# Patient Record
Sex: Female | Born: 1945
Health system: Southern US, Community
[De-identification: ages and names within clinical notes are randomized; demographics above are authoritative.]

## PROBLEM LIST (undated history)

## (undated) DIAGNOSIS — Z6827 Body mass index (BMI) 27.0-27.9, adult: Secondary | ICD-10-CM

## (undated) DIAGNOSIS — E663 Overweight: Secondary | ICD-10-CM

## (undated) HISTORY — DX: Body mass index (BMI) 27.0-27.9, adult: Z68.27

## (undated) HISTORY — PX: KNEE ARTHROSCOPY: SHX127

## (undated) HISTORY — PX: EXCISION OF BREAST BIOPSY: SHX5822

## (undated) HISTORY — PX: ABDOMINOPLASTY: SHX5355

## (undated) HISTORY — PX: LIPOSUCTION: SHX10

## (undated) HISTORY — DX: Overweight: E66.3

---

## 2013-08-06 DIAGNOSIS — H4011X0 Primary open-angle glaucoma, stage unspecified: Secondary | ICD-10-CM | POA: Diagnosis not present

## 2013-08-07 DIAGNOSIS — J209 Acute bronchitis, unspecified: Secondary | ICD-10-CM | POA: Diagnosis not present

## 2013-08-07 DIAGNOSIS — R0609 Other forms of dyspnea: Secondary | ICD-10-CM | POA: Diagnosis not present

## 2013-09-13 DIAGNOSIS — H4011X0 Primary open-angle glaucoma, stage unspecified: Secondary | ICD-10-CM | POA: Diagnosis not present

## 2013-11-08 DIAGNOSIS — H4011X0 Primary open-angle glaucoma, stage unspecified: Secondary | ICD-10-CM | POA: Diagnosis not present

## 2014-03-10 DIAGNOSIS — H4011X0 Primary open-angle glaucoma, stage unspecified: Secondary | ICD-10-CM | POA: Diagnosis not present

## 2014-03-19 DIAGNOSIS — L570 Actinic keratosis: Secondary | ICD-10-CM | POA: Diagnosis not present

## 2014-03-19 DIAGNOSIS — L255 Unspecified contact dermatitis due to plants, except food: Secondary | ICD-10-CM | POA: Diagnosis not present

## 2014-03-19 DIAGNOSIS — D239 Other benign neoplasm of skin, unspecified: Secondary | ICD-10-CM | POA: Diagnosis not present

## 2014-03-19 DIAGNOSIS — L821 Other seborrheic keratosis: Secondary | ICD-10-CM | POA: Diagnosis not present

## 2014-03-22 DIAGNOSIS — Z23 Encounter for immunization: Secondary | ICD-10-CM | POA: Diagnosis not present

## 2014-03-22 DIAGNOSIS — L255 Unspecified contact dermatitis due to plants, except food: Secondary | ICD-10-CM | POA: Diagnosis not present

## 2014-04-10 DIAGNOSIS — M5408 Panniculitis affecting regions of neck and back, sacral and sacrococcygeal region: Secondary | ICD-10-CM | POA: Diagnosis not present

## 2014-04-10 DIAGNOSIS — M9903 Segmental and somatic dysfunction of lumbar region: Secondary | ICD-10-CM | POA: Diagnosis not present

## 2014-04-10 DIAGNOSIS — M9901 Segmental and somatic dysfunction of cervical region: Secondary | ICD-10-CM | POA: Diagnosis not present

## 2014-04-10 DIAGNOSIS — M9904 Segmental and somatic dysfunction of sacral region: Secondary | ICD-10-CM | POA: Diagnosis not present

## 2014-04-10 DIAGNOSIS — M9902 Segmental and somatic dysfunction of thoracic region: Secondary | ICD-10-CM | POA: Diagnosis not present

## 2014-04-14 DIAGNOSIS — M9902 Segmental and somatic dysfunction of thoracic region: Secondary | ICD-10-CM | POA: Diagnosis not present

## 2014-04-14 DIAGNOSIS — M5408 Panniculitis affecting regions of neck and back, sacral and sacrococcygeal region: Secondary | ICD-10-CM | POA: Diagnosis not present

## 2014-04-14 DIAGNOSIS — M9901 Segmental and somatic dysfunction of cervical region: Secondary | ICD-10-CM | POA: Diagnosis not present

## 2014-04-14 DIAGNOSIS — M9903 Segmental and somatic dysfunction of lumbar region: Secondary | ICD-10-CM | POA: Diagnosis not present

## 2014-04-14 DIAGNOSIS — M9904 Segmental and somatic dysfunction of sacral region: Secondary | ICD-10-CM | POA: Diagnosis not present

## 2014-04-21 DIAGNOSIS — H4011X3 Primary open-angle glaucoma, severe stage: Secondary | ICD-10-CM | POA: Diagnosis not present

## 2014-04-21 DIAGNOSIS — H4011X1 Primary open-angle glaucoma, mild stage: Secondary | ICD-10-CM | POA: Diagnosis not present

## 2014-04-23 DIAGNOSIS — H01005 Unspecified blepharitis left lower eyelid: Secondary | ICD-10-CM | POA: Diagnosis not present

## 2014-04-23 DIAGNOSIS — H01004 Unspecified blepharitis left upper eyelid: Secondary | ICD-10-CM | POA: Diagnosis not present

## 2014-04-23 DIAGNOSIS — H4010X3 Unspecified open-angle glaucoma, severe stage: Secondary | ICD-10-CM | POA: Diagnosis not present

## 2014-04-23 DIAGNOSIS — H01002 Unspecified blepharitis right lower eyelid: Secondary | ICD-10-CM | POA: Diagnosis not present

## 2014-04-23 DIAGNOSIS — H01001 Unspecified blepharitis right upper eyelid: Secondary | ICD-10-CM | POA: Diagnosis not present

## 2014-05-05 DIAGNOSIS — Z0000 Encounter for general adult medical examination without abnormal findings: Secondary | ICD-10-CM | POA: Diagnosis not present

## 2014-05-05 DIAGNOSIS — A609 Anogenital herpesviral infection, unspecified: Secondary | ICD-10-CM | POA: Diagnosis not present

## 2014-05-05 DIAGNOSIS — Z113 Encounter for screening for infections with a predominantly sexual mode of transmission: Secondary | ICD-10-CM | POA: Diagnosis not present

## 2014-05-05 DIAGNOSIS — R61 Generalized hyperhidrosis: Secondary | ICD-10-CM | POA: Diagnosis not present

## 2014-05-05 DIAGNOSIS — R635 Abnormal weight gain: Secondary | ICD-10-CM | POA: Diagnosis not present

## 2014-05-05 DIAGNOSIS — Z6830 Body mass index (BMI) 30.0-30.9, adult: Secondary | ICD-10-CM | POA: Diagnosis not present

## 2014-05-05 DIAGNOSIS — R05 Cough: Secondary | ICD-10-CM | POA: Diagnosis not present

## 2014-05-05 DIAGNOSIS — Z23 Encounter for immunization: Secondary | ICD-10-CM | POA: Diagnosis not present

## 2014-05-05 DIAGNOSIS — M859 Disorder of bone density and structure, unspecified: Secondary | ICD-10-CM | POA: Diagnosis not present

## 2014-05-08 DIAGNOSIS — R05 Cough: Secondary | ICD-10-CM | POA: Diagnosis not present

## 2014-05-09 DIAGNOSIS — Z6830 Body mass index (BMI) 30.0-30.9, adult: Secondary | ICD-10-CM | POA: Diagnosis not present

## 2014-05-09 DIAGNOSIS — R635 Abnormal weight gain: Secondary | ICD-10-CM | POA: Diagnosis not present

## 2014-05-09 DIAGNOSIS — M859 Disorder of bone density and structure, unspecified: Secondary | ICD-10-CM | POA: Diagnosis not present

## 2014-05-09 DIAGNOSIS — R61 Generalized hyperhidrosis: Secondary | ICD-10-CM | POA: Diagnosis not present

## 2014-05-09 DIAGNOSIS — E780 Pure hypercholesterolemia: Secondary | ICD-10-CM | POA: Diagnosis not present

## 2014-05-09 DIAGNOSIS — Z0000 Encounter for general adult medical examination without abnormal findings: Secondary | ICD-10-CM | POA: Diagnosis not present

## 2014-05-13 DIAGNOSIS — M8580 Other specified disorders of bone density and structure, unspecified site: Secondary | ICD-10-CM | POA: Diagnosis not present

## 2014-05-13 DIAGNOSIS — Z1231 Encounter for screening mammogram for malignant neoplasm of breast: Secondary | ICD-10-CM | POA: Diagnosis not present

## 2014-06-06 DIAGNOSIS — H4011X1 Primary open-angle glaucoma, mild stage: Secondary | ICD-10-CM | POA: Diagnosis not present

## 2014-06-06 DIAGNOSIS — H4011X3 Primary open-angle glaucoma, severe stage: Secondary | ICD-10-CM | POA: Diagnosis not present

## 2014-07-15 DIAGNOSIS — H4011X1 Primary open-angle glaucoma, mild stage: Secondary | ICD-10-CM | POA: Diagnosis not present

## 2014-07-15 DIAGNOSIS — H4011X3 Primary open-angle glaucoma, severe stage: Secondary | ICD-10-CM | POA: Diagnosis not present

## 2014-07-25 DIAGNOSIS — H01004 Unspecified blepharitis left upper eyelid: Secondary | ICD-10-CM | POA: Diagnosis not present

## 2014-07-25 DIAGNOSIS — H01001 Unspecified blepharitis right upper eyelid: Secondary | ICD-10-CM | POA: Diagnosis not present

## 2014-07-25 DIAGNOSIS — H01005 Unspecified blepharitis left lower eyelid: Secondary | ICD-10-CM | POA: Diagnosis not present

## 2014-07-25 DIAGNOSIS — H01002 Unspecified blepharitis right lower eyelid: Secondary | ICD-10-CM | POA: Diagnosis not present

## 2014-07-25 DIAGNOSIS — H5213 Myopia, bilateral: Secondary | ICD-10-CM | POA: Diagnosis not present

## 2014-07-29 DIAGNOSIS — L820 Inflamed seborrheic keratosis: Secondary | ICD-10-CM | POA: Diagnosis not present

## 2014-07-29 DIAGNOSIS — L570 Actinic keratosis: Secondary | ICD-10-CM | POA: Diagnosis not present

## 2014-10-07 DIAGNOSIS — H4011X1 Primary open-angle glaucoma, mild stage: Secondary | ICD-10-CM | POA: Diagnosis not present

## 2014-10-07 DIAGNOSIS — T1511XA Foreign body in conjunctival sac, right eye, initial encounter: Secondary | ICD-10-CM | POA: Diagnosis not present

## 2014-10-07 DIAGNOSIS — H4011X3 Primary open-angle glaucoma, severe stage: Secondary | ICD-10-CM | POA: Diagnosis not present

## 2014-12-17 DIAGNOSIS — H4011X1 Primary open-angle glaucoma, mild stage: Secondary | ICD-10-CM | POA: Diagnosis not present

## 2014-12-17 DIAGNOSIS — H4011X3 Primary open-angle glaucoma, severe stage: Secondary | ICD-10-CM | POA: Diagnosis not present

## 2015-01-28 DIAGNOSIS — H4011X1 Primary open-angle glaucoma, mild stage: Secondary | ICD-10-CM | POA: Diagnosis not present

## 2015-01-28 DIAGNOSIS — H4011X3 Primary open-angle glaucoma, severe stage: Secondary | ICD-10-CM | POA: Diagnosis not present

## 2015-02-10 DIAGNOSIS — H4010X0 Unspecified open-angle glaucoma, stage unspecified: Secondary | ICD-10-CM | POA: Diagnosis not present

## 2015-02-10 DIAGNOSIS — H4011X3 Primary open-angle glaucoma, severe stage: Secondary | ICD-10-CM | POA: Diagnosis not present

## 2015-02-27 DIAGNOSIS — J189 Pneumonia, unspecified organism: Secondary | ICD-10-CM | POA: Diagnosis not present

## 2015-04-09 DIAGNOSIS — D225 Melanocytic nevi of trunk: Secondary | ICD-10-CM | POA: Diagnosis not present

## 2015-04-09 DIAGNOSIS — L821 Other seborrheic keratosis: Secondary | ICD-10-CM | POA: Diagnosis not present

## 2015-04-09 DIAGNOSIS — L570 Actinic keratosis: Secondary | ICD-10-CM | POA: Diagnosis not present

## 2015-04-29 DIAGNOSIS — Z23 Encounter for immunization: Secondary | ICD-10-CM | POA: Diagnosis not present

## 2015-06-02 DIAGNOSIS — Z1231 Encounter for screening mammogram for malignant neoplasm of breast: Secondary | ICD-10-CM | POA: Diagnosis not present

## 2015-07-29 DIAGNOSIS — H401193 Primary open-angle glaucoma, unspecified eye, severe stage: Secondary | ICD-10-CM | POA: Diagnosis not present

## 2015-07-31 DIAGNOSIS — I249 Acute ischemic heart disease, unspecified: Secondary | ICD-10-CM | POA: Diagnosis not present

## 2015-07-31 DIAGNOSIS — R001 Bradycardia, unspecified: Secondary | ICD-10-CM | POA: Diagnosis not present

## 2015-07-31 DIAGNOSIS — R0789 Other chest pain: Secondary | ICD-10-CM | POA: Diagnosis not present

## 2015-07-31 DIAGNOSIS — R7989 Other specified abnormal findings of blood chemistry: Secondary | ICD-10-CM | POA: Diagnosis not present

## 2015-07-31 DIAGNOSIS — R079 Chest pain, unspecified: Secondary | ICD-10-CM | POA: Diagnosis not present

## 2015-07-31 DIAGNOSIS — R05 Cough: Secondary | ICD-10-CM | POA: Diagnosis not present

## 2015-08-01 DIAGNOSIS — R001 Bradycardia, unspecified: Secondary | ICD-10-CM | POA: Diagnosis present

## 2015-08-01 DIAGNOSIS — K219 Gastro-esophageal reflux disease without esophagitis: Secondary | ICD-10-CM | POA: Diagnosis present

## 2015-08-01 DIAGNOSIS — I249 Acute ischemic heart disease, unspecified: Secondary | ICD-10-CM | POA: Diagnosis not present

## 2015-08-01 DIAGNOSIS — Z79899 Other long term (current) drug therapy: Secondary | ICD-10-CM | POA: Diagnosis not present

## 2015-08-01 DIAGNOSIS — Z882 Allergy status to sulfonamides status: Secondary | ICD-10-CM | POA: Diagnosis not present

## 2015-08-01 DIAGNOSIS — Z881 Allergy status to other antibiotic agents status: Secondary | ICD-10-CM | POA: Diagnosis not present

## 2015-08-01 DIAGNOSIS — R05 Cough: Secondary | ICD-10-CM | POA: Diagnosis not present

## 2015-08-01 DIAGNOSIS — R079 Chest pain, unspecified: Secondary | ICD-10-CM | POA: Diagnosis not present

## 2015-08-01 DIAGNOSIS — R7989 Other specified abnormal findings of blood chemistry: Secondary | ICD-10-CM | POA: Diagnosis not present

## 2015-08-01 DIAGNOSIS — R0789 Other chest pain: Secondary | ICD-10-CM | POA: Diagnosis not present

## 2015-08-01 DIAGNOSIS — R0902 Hypoxemia: Secondary | ICD-10-CM | POA: Diagnosis present

## 2015-08-01 DIAGNOSIS — R030 Elevated blood-pressure reading, without diagnosis of hypertension: Secondary | ICD-10-CM | POA: Diagnosis not present

## 2015-08-11 DIAGNOSIS — H401123 Primary open-angle glaucoma, left eye, severe stage: Secondary | ICD-10-CM | POA: Diagnosis not present

## 2015-08-11 DIAGNOSIS — R079 Chest pain, unspecified: Secondary | ICD-10-CM | POA: Diagnosis not present

## 2015-08-11 DIAGNOSIS — R05 Cough: Secondary | ICD-10-CM | POA: Diagnosis not present

## 2015-08-11 DIAGNOSIS — H401111 Primary open-angle glaucoma, right eye, mild stage: Secondary | ICD-10-CM | POA: Diagnosis not present

## 2015-09-01 DIAGNOSIS — R072 Precordial pain: Secondary | ICD-10-CM | POA: Diagnosis not present

## 2015-09-01 DIAGNOSIS — K219 Gastro-esophageal reflux disease without esophagitis: Secondary | ICD-10-CM | POA: Diagnosis not present

## 2015-09-01 DIAGNOSIS — R05 Cough: Secondary | ICD-10-CM | POA: Diagnosis not present

## 2015-09-28 DIAGNOSIS — H01005 Unspecified blepharitis left lower eyelid: Secondary | ICD-10-CM | POA: Diagnosis not present

## 2015-09-28 DIAGNOSIS — H01004 Unspecified blepharitis left upper eyelid: Secondary | ICD-10-CM | POA: Diagnosis not present

## 2015-09-28 DIAGNOSIS — H01001 Unspecified blepharitis right upper eyelid: Secondary | ICD-10-CM | POA: Diagnosis not present

## 2015-09-28 DIAGNOSIS — H01002 Unspecified blepharitis right lower eyelid: Secondary | ICD-10-CM | POA: Diagnosis not present

## 2015-11-04 DIAGNOSIS — J3089 Other allergic rhinitis: Secondary | ICD-10-CM | POA: Diagnosis not present

## 2015-11-04 DIAGNOSIS — Z23 Encounter for immunization: Secondary | ICD-10-CM | POA: Diagnosis not present

## 2015-11-04 DIAGNOSIS — E669 Obesity, unspecified: Secondary | ICD-10-CM | POA: Diagnosis not present

## 2015-11-04 DIAGNOSIS — Z0000 Encounter for general adult medical examination without abnormal findings: Secondary | ICD-10-CM | POA: Diagnosis not present

## 2015-11-04 DIAGNOSIS — K219 Gastro-esophageal reflux disease without esophagitis: Secondary | ICD-10-CM | POA: Diagnosis not present

## 2015-11-04 DIAGNOSIS — R05 Cough: Secondary | ICD-10-CM | POA: Diagnosis not present

## 2015-11-04 DIAGNOSIS — Z79899 Other long term (current) drug therapy: Secondary | ICD-10-CM | POA: Diagnosis not present

## 2015-11-04 DIAGNOSIS — Z136 Encounter for screening for cardiovascular disorders: Secondary | ICD-10-CM | POA: Diagnosis not present

## 2015-11-04 DIAGNOSIS — M859 Disorder of bone density and structure, unspecified: Secondary | ICD-10-CM | POA: Diagnosis not present

## 2015-11-04 DIAGNOSIS — Z6830 Body mass index (BMI) 30.0-30.9, adult: Secondary | ICD-10-CM | POA: Diagnosis not present

## 2015-11-27 DIAGNOSIS — H401111 Primary open-angle glaucoma, right eye, mild stage: Secondary | ICD-10-CM | POA: Diagnosis not present

## 2015-11-27 DIAGNOSIS — H401123 Primary open-angle glaucoma, left eye, severe stage: Secondary | ICD-10-CM | POA: Diagnosis not present

## 2015-12-03 DIAGNOSIS — H01004 Unspecified blepharitis left upper eyelid: Secondary | ICD-10-CM | POA: Diagnosis not present

## 2015-12-03 DIAGNOSIS — H01001 Unspecified blepharitis right upper eyelid: Secondary | ICD-10-CM | POA: Diagnosis not present

## 2015-12-03 DIAGNOSIS — H01005 Unspecified blepharitis left lower eyelid: Secondary | ICD-10-CM | POA: Diagnosis not present

## 2015-12-03 DIAGNOSIS — H01002 Unspecified blepharitis right lower eyelid: Secondary | ICD-10-CM | POA: Diagnosis not present

## 2015-12-07 DIAGNOSIS — J069 Acute upper respiratory infection, unspecified: Secondary | ICD-10-CM | POA: Diagnosis not present

## 2016-01-13 DIAGNOSIS — H01002 Unspecified blepharitis right lower eyelid: Secondary | ICD-10-CM | POA: Diagnosis not present

## 2016-01-13 DIAGNOSIS — H01001 Unspecified blepharitis right upper eyelid: Secondary | ICD-10-CM | POA: Diagnosis not present

## 2016-01-13 DIAGNOSIS — H01005 Unspecified blepharitis left lower eyelid: Secondary | ICD-10-CM | POA: Diagnosis not present

## 2016-01-13 DIAGNOSIS — H5213 Myopia, bilateral: Secondary | ICD-10-CM | POA: Diagnosis not present

## 2016-01-13 DIAGNOSIS — H01004 Unspecified blepharitis left upper eyelid: Secondary | ICD-10-CM | POA: Diagnosis not present

## 2016-04-01 DIAGNOSIS — H401123 Primary open-angle glaucoma, left eye, severe stage: Secondary | ICD-10-CM | POA: Diagnosis not present

## 2016-04-05 DIAGNOSIS — J0100 Acute maxillary sinusitis, unspecified: Secondary | ICD-10-CM | POA: Diagnosis not present

## 2016-04-21 DIAGNOSIS — Z23 Encounter for immunization: Secondary | ICD-10-CM | POA: Diagnosis not present

## 2016-05-23 DIAGNOSIS — H01002 Unspecified blepharitis right lower eyelid: Secondary | ICD-10-CM | POA: Diagnosis not present

## 2016-05-23 DIAGNOSIS — H01001 Unspecified blepharitis right upper eyelid: Secondary | ICD-10-CM | POA: Diagnosis not present

## 2016-05-23 DIAGNOSIS — H01005 Unspecified blepharitis left lower eyelid: Secondary | ICD-10-CM | POA: Diagnosis not present

## 2016-05-23 DIAGNOSIS — H01004 Unspecified blepharitis left upper eyelid: Secondary | ICD-10-CM | POA: Diagnosis not present

## 2016-05-24 DIAGNOSIS — D225 Melanocytic nevi of trunk: Secondary | ICD-10-CM | POA: Diagnosis not present

## 2016-05-24 DIAGNOSIS — L859 Epidermal thickening, unspecified: Secondary | ICD-10-CM | POA: Diagnosis not present

## 2016-05-24 DIAGNOSIS — L821 Other seborrheic keratosis: Secondary | ICD-10-CM | POA: Diagnosis not present

## 2016-05-24 DIAGNOSIS — Z23 Encounter for immunization: Secondary | ICD-10-CM | POA: Diagnosis not present

## 2016-05-24 DIAGNOSIS — L570 Actinic keratosis: Secondary | ICD-10-CM | POA: Diagnosis not present

## 2016-05-24 DIAGNOSIS — L814 Other melanin hyperpigmentation: Secondary | ICD-10-CM | POA: Diagnosis not present

## 2016-05-24 DIAGNOSIS — D1801 Hemangioma of skin and subcutaneous tissue: Secondary | ICD-10-CM | POA: Diagnosis not present

## 2016-07-27 DIAGNOSIS — H401123 Primary open-angle glaucoma, left eye, severe stage: Secondary | ICD-10-CM | POA: Diagnosis not present

## 2016-09-05 DIAGNOSIS — H401111 Primary open-angle glaucoma, right eye, mild stage: Secondary | ICD-10-CM | POA: Diagnosis not present

## 2016-09-05 DIAGNOSIS — H401123 Primary open-angle glaucoma, left eye, severe stage: Secondary | ICD-10-CM | POA: Diagnosis not present

## 2016-09-08 DIAGNOSIS — M25375 Other instability, left foot: Secondary | ICD-10-CM

## 2016-09-08 DIAGNOSIS — M76822 Posterior tibial tendinitis, left leg: Secondary | ICD-10-CM

## 2016-09-08 DIAGNOSIS — S96912A Strain of unspecified muscle and tendon at ankle and foot level, left foot, initial encounter: Secondary | ICD-10-CM

## 2016-09-22 DIAGNOSIS — M76822 Posterior tibial tendinitis, left leg: Secondary | ICD-10-CM | POA: Diagnosis not present

## 2016-10-18 DIAGNOSIS — M76822 Posterior tibial tendinitis, left leg: Secondary | ICD-10-CM | POA: Diagnosis not present

## 2016-10-18 DIAGNOSIS — S96912D Strain of unspecified muscle and tendon at ankle and foot level, left foot, subsequent encounter: Secondary | ICD-10-CM | POA: Diagnosis not present

## 2016-10-31 DIAGNOSIS — R05 Cough: Secondary | ICD-10-CM | POA: Diagnosis not present

## 2016-10-31 DIAGNOSIS — E784 Other hyperlipidemia: Secondary | ICD-10-CM | POA: Diagnosis not present

## 2016-10-31 DIAGNOSIS — R739 Hyperglycemia, unspecified: Secondary | ICD-10-CM | POA: Diagnosis not present

## 2016-10-31 DIAGNOSIS — Z1239 Encounter for other screening for malignant neoplasm of breast: Secondary | ICD-10-CM | POA: Diagnosis not present

## 2016-10-31 DIAGNOSIS — Z79899 Other long term (current) drug therapy: Secondary | ICD-10-CM | POA: Diagnosis not present

## 2016-11-24 DIAGNOSIS — H401123 Primary open-angle glaucoma, left eye, severe stage: Secondary | ICD-10-CM | POA: Diagnosis not present

## 2016-11-24 DIAGNOSIS — H401111 Primary open-angle glaucoma, right eye, mild stage: Secondary | ICD-10-CM | POA: Diagnosis not present

## 2016-12-08 DIAGNOSIS — M76822 Posterior tibial tendinitis, left leg: Secondary | ICD-10-CM | POA: Diagnosis not present

## 2016-12-08 DIAGNOSIS — S96912D Strain of unspecified muscle and tendon at ankle and foot level, left foot, subsequent encounter: Secondary | ICD-10-CM | POA: Diagnosis not present

## 2017-01-16 ENCOUNTER — Other Ambulatory Visit: Payer: Self-pay

## 2017-02-09 DIAGNOSIS — Z79899 Other long term (current) drug therapy: Secondary | ICD-10-CM | POA: Diagnosis not present

## 2017-02-09 DIAGNOSIS — R739 Hyperglycemia, unspecified: Secondary | ICD-10-CM | POA: Diagnosis not present

## 2017-02-09 DIAGNOSIS — R05 Cough: Secondary | ICD-10-CM | POA: Diagnosis not present

## 2017-02-09 DIAGNOSIS — H401111 Primary open-angle glaucoma, right eye, mild stage: Secondary | ICD-10-CM | POA: Diagnosis not present

## 2017-02-09 DIAGNOSIS — E784 Other hyperlipidemia: Secondary | ICD-10-CM | POA: Diagnosis not present

## 2017-02-09 DIAGNOSIS — H401123 Primary open-angle glaucoma, left eye, severe stage: Secondary | ICD-10-CM | POA: Diagnosis not present

## 2017-02-10 DIAGNOSIS — Z1231 Encounter for screening mammogram for malignant neoplasm of breast: Secondary | ICD-10-CM | POA: Diagnosis not present

## 2017-02-10 DIAGNOSIS — M8589 Other specified disorders of bone density and structure, multiple sites: Secondary | ICD-10-CM | POA: Diagnosis not present

## 2017-02-13 DIAGNOSIS — N644 Mastodynia: Secondary | ICD-10-CM | POA: Diagnosis not present

## 2017-02-13 DIAGNOSIS — Z0000 Encounter for general adult medical examination without abnormal findings: Secondary | ICD-10-CM | POA: Diagnosis not present

## 2017-02-13 DIAGNOSIS — E663 Overweight: Secondary | ICD-10-CM | POA: Diagnosis not present

## 2017-02-13 DIAGNOSIS — Z6828 Body mass index (BMI) 28.0-28.9, adult: Secondary | ICD-10-CM | POA: Diagnosis not present

## 2017-02-13 DIAGNOSIS — M79609 Pain in unspecified limb: Secondary | ICD-10-CM | POA: Diagnosis not present

## 2017-04-04 DIAGNOSIS — Z23 Encounter for immunization: Secondary | ICD-10-CM | POA: Diagnosis not present

## 2017-05-05 DIAGNOSIS — J3481 Nasal mucositis (ulcerative): Secondary | ICD-10-CM | POA: Diagnosis not present

## 2017-05-25 DIAGNOSIS — H401123 Primary open-angle glaucoma, left eye, severe stage: Secondary | ICD-10-CM | POA: Diagnosis not present

## 2017-05-25 DIAGNOSIS — H401111 Primary open-angle glaucoma, right eye, mild stage: Secondary | ICD-10-CM | POA: Diagnosis not present

## 2017-05-29 DIAGNOSIS — Z23 Encounter for immunization: Secondary | ICD-10-CM | POA: Diagnosis not present

## 2017-05-29 DIAGNOSIS — L820 Inflamed seborrheic keratosis: Secondary | ICD-10-CM | POA: Diagnosis not present

## 2017-05-29 DIAGNOSIS — D2271 Melanocytic nevi of right lower limb, including hip: Secondary | ICD-10-CM | POA: Diagnosis not present

## 2017-05-29 DIAGNOSIS — L739 Follicular disorder, unspecified: Secondary | ICD-10-CM | POA: Diagnosis not present

## 2017-05-29 DIAGNOSIS — L821 Other seborrheic keratosis: Secondary | ICD-10-CM | POA: Diagnosis not present

## 2017-05-29 DIAGNOSIS — D225 Melanocytic nevi of trunk: Secondary | ICD-10-CM | POA: Diagnosis not present

## 2017-05-29 DIAGNOSIS — L814 Other melanin hyperpigmentation: Secondary | ICD-10-CM | POA: Diagnosis not present

## 2017-05-29 DIAGNOSIS — D1801 Hemangioma of skin and subcutaneous tissue: Secondary | ICD-10-CM | POA: Diagnosis not present

## 2017-05-29 DIAGNOSIS — L570 Actinic keratosis: Secondary | ICD-10-CM | POA: Diagnosis not present

## 2017-08-16 DIAGNOSIS — H401111 Primary open-angle glaucoma, right eye, mild stage: Secondary | ICD-10-CM | POA: Diagnosis not present

## 2017-08-16 DIAGNOSIS — H401123 Primary open-angle glaucoma, left eye, severe stage: Secondary | ICD-10-CM | POA: Diagnosis not present

## 2017-12-13 DIAGNOSIS — H401111 Primary open-angle glaucoma, right eye, mild stage: Secondary | ICD-10-CM | POA: Diagnosis not present

## 2017-12-13 DIAGNOSIS — H401123 Primary open-angle glaucoma, left eye, severe stage: Secondary | ICD-10-CM | POA: Diagnosis not present

## 2018-01-25 DIAGNOSIS — Z79899 Other long term (current) drug therapy: Secondary | ICD-10-CM | POA: Diagnosis not present

## 2018-01-25 DIAGNOSIS — Z6824 Body mass index (BMI) 24.0-24.9, adult: Secondary | ICD-10-CM | POA: Diagnosis not present

## 2018-01-25 DIAGNOSIS — E7849 Other hyperlipidemia: Secondary | ICD-10-CM | POA: Diagnosis not present

## 2018-01-25 DIAGNOSIS — F329 Major depressive disorder, single episode, unspecified: Secondary | ICD-10-CM | POA: Diagnosis not present

## 2018-01-25 DIAGNOSIS — E785 Hyperlipidemia, unspecified: Secondary | ICD-10-CM | POA: Diagnosis not present

## 2018-02-13 DIAGNOSIS — H401111 Primary open-angle glaucoma, right eye, mild stage: Secondary | ICD-10-CM | POA: Diagnosis not present

## 2018-02-13 DIAGNOSIS — H401123 Primary open-angle glaucoma, left eye, severe stage: Secondary | ICD-10-CM | POA: Diagnosis not present

## 2018-02-19 DIAGNOSIS — Z1231 Encounter for screening mammogram for malignant neoplasm of breast: Secondary | ICD-10-CM | POA: Diagnosis not present

## 2018-02-22 DIAGNOSIS — Z6823 Body mass index (BMI) 23.0-23.9, adult: Secondary | ICD-10-CM | POA: Diagnosis not present

## 2018-02-22 DIAGNOSIS — F324 Major depressive disorder, single episode, in partial remission: Secondary | ICD-10-CM | POA: Diagnosis not present

## 2018-02-22 DIAGNOSIS — Z0000 Encounter for general adult medical examination without abnormal findings: Secondary | ICD-10-CM | POA: Diagnosis not present

## 2018-02-22 DIAGNOSIS — M8580 Other specified disorders of bone density and structure, unspecified site: Secondary | ICD-10-CM | POA: Diagnosis not present

## 2018-02-22 DIAGNOSIS — R05 Cough: Secondary | ICD-10-CM | POA: Diagnosis not present

## 2018-03-13 DIAGNOSIS — H401123 Primary open-angle glaucoma, left eye, severe stage: Secondary | ICD-10-CM | POA: Diagnosis not present

## 2018-03-13 DIAGNOSIS — H401111 Primary open-angle glaucoma, right eye, mild stage: Secondary | ICD-10-CM | POA: Diagnosis not present

## 2018-04-19 DIAGNOSIS — Z23 Encounter for immunization: Secondary | ICD-10-CM | POA: Diagnosis not present

## 2018-05-18 DIAGNOSIS — L659 Nonscarring hair loss, unspecified: Secondary | ICD-10-CM | POA: Diagnosis not present

## 2018-05-18 DIAGNOSIS — Z6823 Body mass index (BMI) 23.0-23.9, adult: Secondary | ICD-10-CM | POA: Diagnosis not present

## 2018-05-25 DIAGNOSIS — J0100 Acute maxillary sinusitis, unspecified: Secondary | ICD-10-CM | POA: Diagnosis not present

## 2018-06-05 DIAGNOSIS — D224 Melanocytic nevi of scalp and neck: Secondary | ICD-10-CM | POA: Diagnosis not present

## 2018-06-05 DIAGNOSIS — D2271 Melanocytic nevi of right lower limb, including hip: Secondary | ICD-10-CM | POA: Diagnosis not present

## 2018-06-05 DIAGNOSIS — L570 Actinic keratosis: Secondary | ICD-10-CM | POA: Diagnosis not present

## 2018-06-05 DIAGNOSIS — Z23 Encounter for immunization: Secondary | ICD-10-CM | POA: Diagnosis not present

## 2018-06-05 DIAGNOSIS — D225 Melanocytic nevi of trunk: Secondary | ICD-10-CM | POA: Diagnosis not present

## 2018-06-05 DIAGNOSIS — L814 Other melanin hyperpigmentation: Secondary | ICD-10-CM | POA: Diagnosis not present

## 2018-06-05 DIAGNOSIS — L821 Other seborrheic keratosis: Secondary | ICD-10-CM | POA: Diagnosis not present

## 2018-06-12 DIAGNOSIS — H401123 Primary open-angle glaucoma, left eye, severe stage: Secondary | ICD-10-CM | POA: Diagnosis not present

## 2018-06-12 DIAGNOSIS — H401111 Primary open-angle glaucoma, right eye, mild stage: Secondary | ICD-10-CM | POA: Diagnosis not present

## 2018-09-05 DIAGNOSIS — R1032 Left lower quadrant pain: Secondary | ICD-10-CM | POA: Diagnosis not present

## 2018-09-05 DIAGNOSIS — R05 Cough: Secondary | ICD-10-CM | POA: Diagnosis not present

## 2018-09-05 DIAGNOSIS — Z79899 Other long term (current) drug therapy: Secondary | ICD-10-CM | POA: Diagnosis not present

## 2018-09-05 DIAGNOSIS — N281 Cyst of kidney, acquired: Secondary | ICD-10-CM | POA: Diagnosis not present

## 2018-09-05 DIAGNOSIS — R001 Bradycardia, unspecified: Secondary | ICD-10-CM | POA: Diagnosis not present

## 2018-09-05 DIAGNOSIS — R9431 Abnormal electrocardiogram [ECG] [EKG]: Secondary | ICD-10-CM | POA: Diagnosis not present

## 2018-09-05 DIAGNOSIS — K55069 Acute infarction of intestine, part and extent unspecified: Secondary | ICD-10-CM | POA: Diagnosis present

## 2018-09-05 DIAGNOSIS — K5669 Other partial intestinal obstruction: Secondary | ICD-10-CM | POA: Diagnosis not present

## 2018-09-05 DIAGNOSIS — K56699 Other intestinal obstruction unspecified as to partial versus complete obstruction: Secondary | ICD-10-CM | POA: Diagnosis not present

## 2018-09-05 DIAGNOSIS — J45909 Unspecified asthma, uncomplicated: Secondary | ICD-10-CM | POA: Diagnosis not present

## 2018-09-05 DIAGNOSIS — I348 Other nonrheumatic mitral valve disorders: Secondary | ICD-10-CM | POA: Diagnosis not present

## 2018-09-05 DIAGNOSIS — I340 Nonrheumatic mitral (valve) insufficiency: Secondary | ICD-10-CM | POA: Diagnosis not present

## 2018-09-05 DIAGNOSIS — K56609 Unspecified intestinal obstruction, unspecified as to partial versus complete obstruction: Secondary | ICD-10-CM | POA: Diagnosis not present

## 2018-09-05 DIAGNOSIS — Z881 Allergy status to other antibiotic agents status: Secondary | ICD-10-CM | POA: Diagnosis not present

## 2018-09-05 DIAGNOSIS — R112 Nausea with vomiting, unspecified: Secondary | ICD-10-CM | POA: Diagnosis not present

## 2018-09-05 DIAGNOSIS — Z888 Allergy status to other drugs, medicaments and biological substances status: Secondary | ICD-10-CM | POA: Diagnosis not present

## 2018-09-05 DIAGNOSIS — D72829 Elevated white blood cell count, unspecified: Secondary | ICD-10-CM | POA: Diagnosis not present

## 2018-09-05 DIAGNOSIS — R609 Edema, unspecified: Secondary | ICD-10-CM | POA: Diagnosis not present

## 2018-09-05 DIAGNOSIS — Z87891 Personal history of nicotine dependence: Secondary | ICD-10-CM | POA: Diagnosis not present

## 2018-09-05 DIAGNOSIS — H409 Unspecified glaucoma: Secondary | ICD-10-CM | POA: Diagnosis not present

## 2018-09-05 DIAGNOSIS — K219 Gastro-esophageal reflux disease without esophagitis: Secondary | ICD-10-CM | POA: Diagnosis not present

## 2018-09-05 DIAGNOSIS — Z8249 Family history of ischemic heart disease and other diseases of the circulatory system: Secondary | ICD-10-CM | POA: Diagnosis not present

## 2018-09-05 DIAGNOSIS — R0789 Other chest pain: Secondary | ICD-10-CM | POA: Diagnosis not present

## 2018-09-05 DIAGNOSIS — Z4682 Encounter for fitting and adjustment of non-vascular catheter: Secondary | ICD-10-CM | POA: Diagnosis not present

## 2018-09-05 DIAGNOSIS — I361 Nonrheumatic tricuspid (valve) insufficiency: Secondary | ICD-10-CM | POA: Diagnosis not present

## 2018-09-05 DIAGNOSIS — R0602 Shortness of breath: Secondary | ICD-10-CM | POA: Diagnosis not present

## 2018-09-05 DIAGNOSIS — R109 Unspecified abdominal pain: Secondary | ICD-10-CM | POA: Diagnosis not present

## 2018-09-11 DIAGNOSIS — R112 Nausea with vomiting, unspecified: Secondary | ICD-10-CM | POA: Diagnosis not present

## 2018-09-11 DIAGNOSIS — R100 Acute abdomen: Secondary | ICD-10-CM | POA: Diagnosis not present

## 2018-09-11 DIAGNOSIS — K56609 Unspecified intestinal obstruction, unspecified as to partial versus complete obstruction: Secondary | ICD-10-CM | POA: Diagnosis not present

## 2018-09-11 DIAGNOSIS — K219 Gastro-esophageal reflux disease without esophagitis: Secondary | ICD-10-CM | POA: Diagnosis not present

## 2018-09-11 DIAGNOSIS — Z4682 Encounter for fitting and adjustment of non-vascular catheter: Secondary | ICD-10-CM | POA: Diagnosis not present

## 2018-09-11 DIAGNOSIS — K56600 Partial intestinal obstruction, unspecified as to cause: Secondary | ICD-10-CM | POA: Diagnosis present

## 2018-09-11 DIAGNOSIS — K56699 Other intestinal obstruction unspecified as to partial versus complete obstruction: Secondary | ICD-10-CM | POA: Diagnosis not present

## 2018-09-11 DIAGNOSIS — Z79891 Long term (current) use of opiate analgesic: Secondary | ICD-10-CM | POA: Diagnosis not present

## 2018-09-11 DIAGNOSIS — J45909 Unspecified asthma, uncomplicated: Secondary | ICD-10-CM | POA: Diagnosis not present

## 2018-09-11 DIAGNOSIS — R109 Unspecified abdominal pain: Secondary | ICD-10-CM | POA: Diagnosis not present

## 2018-09-11 DIAGNOSIS — H409 Unspecified glaucoma: Secondary | ICD-10-CM | POA: Diagnosis not present

## 2018-09-11 DIAGNOSIS — R1110 Vomiting, unspecified: Secondary | ICD-10-CM | POA: Diagnosis not present

## 2018-09-11 DIAGNOSIS — E860 Dehydration: Secondary | ICD-10-CM | POA: Diagnosis not present

## 2018-09-11 DIAGNOSIS — R001 Bradycardia, unspecified: Secondary | ICD-10-CM | POA: Diagnosis not present

## 2018-09-11 DIAGNOSIS — R1084 Generalized abdominal pain: Secondary | ICD-10-CM | POA: Diagnosis not present

## 2018-09-11 DIAGNOSIS — Z79899 Other long term (current) drug therapy: Secondary | ICD-10-CM | POA: Diagnosis not present

## 2018-09-16 DIAGNOSIS — J45909 Unspecified asthma, uncomplicated: Secondary | ICD-10-CM | POA: Diagnosis not present

## 2018-09-16 DIAGNOSIS — K56699 Other intestinal obstruction unspecified as to partial versus complete obstruction: Secondary | ICD-10-CM | POA: Diagnosis not present

## 2018-09-16 DIAGNOSIS — E876 Hypokalemia: Secondary | ICD-10-CM | POA: Diagnosis not present

## 2018-09-16 DIAGNOSIS — Z79899 Other long term (current) drug therapy: Secondary | ICD-10-CM | POA: Diagnosis not present

## 2018-09-16 DIAGNOSIS — R001 Bradycardia, unspecified: Secondary | ICD-10-CM | POA: Diagnosis not present

## 2018-09-16 DIAGNOSIS — R0602 Shortness of breath: Secondary | ICD-10-CM | POA: Diagnosis not present

## 2018-09-16 DIAGNOSIS — K5651 Intestinal adhesions [bands], with partial obstruction: Secondary | ICD-10-CM | POA: Diagnosis not present

## 2018-09-16 DIAGNOSIS — R109 Unspecified abdominal pain: Secondary | ICD-10-CM | POA: Diagnosis not present

## 2018-09-16 DIAGNOSIS — K56609 Unspecified intestinal obstruction, unspecified as to partial versus complete obstruction: Secondary | ICD-10-CM | POA: Diagnosis not present

## 2018-09-16 DIAGNOSIS — K56600 Partial intestinal obstruction, unspecified as to cause: Secondary | ICD-10-CM | POA: Diagnosis not present

## 2018-09-16 DIAGNOSIS — K219 Gastro-esophageal reflux disease without esophagitis: Secondary | ICD-10-CM | POA: Diagnosis present

## 2018-09-16 DIAGNOSIS — E871 Hypo-osmolality and hyponatremia: Secondary | ICD-10-CM | POA: Diagnosis present

## 2018-09-16 DIAGNOSIS — H409 Unspecified glaucoma: Secondary | ICD-10-CM | POA: Diagnosis not present

## 2018-09-16 DIAGNOSIS — Z87891 Personal history of nicotine dependence: Secondary | ICD-10-CM | POA: Diagnosis not present

## 2018-09-16 DIAGNOSIS — R9431 Abnormal electrocardiogram [ECG] [EKG]: Secondary | ICD-10-CM | POA: Diagnosis not present

## 2018-10-01 DIAGNOSIS — Z09 Encounter for follow-up examination after completed treatment for conditions other than malignant neoplasm: Secondary | ICD-10-CM

## 2018-10-01 DIAGNOSIS — K56609 Unspecified intestinal obstruction, unspecified as to partial versus complete obstruction: Secondary | ICD-10-CM | POA: Insufficient documentation

## 2018-10-18 DIAGNOSIS — R001 Bradycardia, unspecified: Secondary | ICD-10-CM | POA: Diagnosis not present

## 2018-10-18 DIAGNOSIS — Z6822 Body mass index (BMI) 22.0-22.9, adult: Secondary | ICD-10-CM | POA: Diagnosis not present

## 2018-10-18 DIAGNOSIS — K5651 Intestinal adhesions [bands], with partial obstruction: Secondary | ICD-10-CM | POA: Diagnosis not present

## 2018-12-03 DIAGNOSIS — H401123 Primary open-angle glaucoma, left eye, severe stage: Secondary | ICD-10-CM | POA: Diagnosis not present

## 2018-12-03 DIAGNOSIS — H401111 Primary open-angle glaucoma, right eye, mild stage: Secondary | ICD-10-CM | POA: Diagnosis not present

## 2019-01-07 DIAGNOSIS — H401111 Primary open-angle glaucoma, right eye, mild stage: Secondary | ICD-10-CM | POA: Diagnosis not present

## 2019-01-07 DIAGNOSIS — H401123 Primary open-angle glaucoma, left eye, severe stage: Secondary | ICD-10-CM | POA: Diagnosis not present

## 2019-02-21 DIAGNOSIS — Z1231 Encounter for screening mammogram for malignant neoplasm of breast: Secondary | ICD-10-CM | POA: Diagnosis not present

## 2019-02-21 DIAGNOSIS — M8589 Other specified disorders of bone density and structure, multiple sites: Secondary | ICD-10-CM | POA: Diagnosis not present

## 2019-02-21 DIAGNOSIS — R29890 Loss of height: Secondary | ICD-10-CM | POA: Diagnosis not present

## 2019-02-21 DIAGNOSIS — R634 Abnormal weight loss: Secondary | ICD-10-CM | POA: Diagnosis not present

## 2019-03-30 DIAGNOSIS — Z23 Encounter for immunization: Secondary | ICD-10-CM | POA: Diagnosis not present

## 2019-04-13 DIAGNOSIS — R509 Fever, unspecified: Secondary | ICD-10-CM | POA: Diagnosis not present

## 2019-04-13 DIAGNOSIS — R5381 Other malaise: Secondary | ICD-10-CM | POA: Diagnosis not present

## 2019-04-13 DIAGNOSIS — Z20828 Contact with and (suspected) exposure to other viral communicable diseases: Secondary | ICD-10-CM | POA: Diagnosis not present

## 2019-04-14 DIAGNOSIS — R6883 Chills (without fever): Secondary | ICD-10-CM | POA: Diagnosis not present

## 2019-04-14 DIAGNOSIS — R509 Fever, unspecified: Secondary | ICD-10-CM | POA: Diagnosis not present

## 2019-04-14 DIAGNOSIS — Z20828 Contact with and (suspected) exposure to other viral communicable diseases: Secondary | ICD-10-CM | POA: Diagnosis not present

## 2019-04-14 DIAGNOSIS — Z03818 Encounter for observation for suspected exposure to other biological agents ruled out: Secondary | ICD-10-CM | POA: Diagnosis not present

## 2019-04-14 DIAGNOSIS — J189 Pneumonia, unspecified organism: Secondary | ICD-10-CM | POA: Diagnosis not present

## 2019-04-14 DIAGNOSIS — R05 Cough: Secondary | ICD-10-CM | POA: Diagnosis not present

## 2019-04-25 DIAGNOSIS — Z0000 Encounter for general adult medical examination without abnormal findings: Secondary | ICD-10-CM | POA: Diagnosis not present

## 2019-04-25 DIAGNOSIS — Z79899 Other long term (current) drug therapy: Secondary | ICD-10-CM | POA: Diagnosis not present

## 2019-04-26 DIAGNOSIS — Z79899 Other long term (current) drug therapy: Secondary | ICD-10-CM | POA: Diagnosis not present

## 2019-04-26 DIAGNOSIS — Z0000 Encounter for general adult medical examination without abnormal findings: Secondary | ICD-10-CM | POA: Diagnosis not present

## 2019-05-06 DIAGNOSIS — H401111 Primary open-angle glaucoma, right eye, mild stage: Secondary | ICD-10-CM | POA: Diagnosis not present

## 2019-05-06 DIAGNOSIS — H401123 Primary open-angle glaucoma, left eye, severe stage: Secondary | ICD-10-CM | POA: Diagnosis not present

## 2019-06-11 DIAGNOSIS — H401123 Primary open-angle glaucoma, left eye, severe stage: Secondary | ICD-10-CM | POA: Diagnosis not present

## 2019-06-11 DIAGNOSIS — H401111 Primary open-angle glaucoma, right eye, mild stage: Secondary | ICD-10-CM | POA: Diagnosis not present

## 2019-07-16 DIAGNOSIS — H401123 Primary open-angle glaucoma, left eye, severe stage: Secondary | ICD-10-CM | POA: Diagnosis not present

## 2019-07-17 DIAGNOSIS — L570 Actinic keratosis: Secondary | ICD-10-CM | POA: Diagnosis not present

## 2019-07-17 DIAGNOSIS — L820 Inflamed seborrheic keratosis: Secondary | ICD-10-CM | POA: Diagnosis not present

## 2019-07-17 DIAGNOSIS — L578 Other skin changes due to chronic exposure to nonionizing radiation: Secondary | ICD-10-CM | POA: Diagnosis not present

## 2019-07-17 DIAGNOSIS — D2271 Melanocytic nevi of right lower limb, including hip: Secondary | ICD-10-CM | POA: Diagnosis not present

## 2019-07-17 DIAGNOSIS — D224 Melanocytic nevi of scalp and neck: Secondary | ICD-10-CM | POA: Diagnosis not present

## 2019-07-17 DIAGNOSIS — L814 Other melanin hyperpigmentation: Secondary | ICD-10-CM | POA: Diagnosis not present

## 2019-07-17 DIAGNOSIS — L821 Other seborrheic keratosis: Secondary | ICD-10-CM | POA: Diagnosis not present

## 2019-07-17 DIAGNOSIS — D225 Melanocytic nevi of trunk: Secondary | ICD-10-CM | POA: Diagnosis not present

## 2019-10-15 DIAGNOSIS — H401111 Primary open-angle glaucoma, right eye, mild stage: Secondary | ICD-10-CM | POA: Diagnosis not present

## 2019-10-15 DIAGNOSIS — H401123 Primary open-angle glaucoma, left eye, severe stage: Secondary | ICD-10-CM | POA: Diagnosis not present

## 2020-02-11 DIAGNOSIS — H401123 Primary open-angle glaucoma, left eye, severe stage: Secondary | ICD-10-CM | POA: Diagnosis not present

## 2020-02-11 DIAGNOSIS — H401111 Primary open-angle glaucoma, right eye, mild stage: Secondary | ICD-10-CM | POA: Diagnosis not present

## 2020-02-21 DIAGNOSIS — J069 Acute upper respiratory infection, unspecified: Secondary | ICD-10-CM | POA: Diagnosis not present

## 2020-03-10 DIAGNOSIS — H401111 Primary open-angle glaucoma, right eye, mild stage: Secondary | ICD-10-CM | POA: Diagnosis not present

## 2020-03-10 DIAGNOSIS — H401123 Primary open-angle glaucoma, left eye, severe stage: Secondary | ICD-10-CM | POA: Diagnosis not present

## 2020-04-23 DIAGNOSIS — H401111 Primary open-angle glaucoma, right eye, mild stage: Secondary | ICD-10-CM | POA: Diagnosis not present

## 2020-04-23 DIAGNOSIS — H401123 Primary open-angle glaucoma, left eye, severe stage: Secondary | ICD-10-CM | POA: Diagnosis not present

## 2020-04-28 DIAGNOSIS — F324 Major depressive disorder, single episode, in partial remission: Secondary | ICD-10-CM | POA: Diagnosis not present

## 2020-04-28 DIAGNOSIS — Z0000 Encounter for general adult medical examination without abnormal findings: Secondary | ICD-10-CM | POA: Diagnosis not present

## 2020-04-28 DIAGNOSIS — E7800 Pure hypercholesterolemia, unspecified: Secondary | ICD-10-CM | POA: Diagnosis not present

## 2020-04-28 DIAGNOSIS — Z6826 Body mass index (BMI) 26.0-26.9, adult: Secondary | ICD-10-CM | POA: Diagnosis not present

## 2020-04-28 DIAGNOSIS — Z23 Encounter for immunization: Secondary | ICD-10-CM | POA: Diagnosis not present

## 2020-04-28 DIAGNOSIS — E7849 Other hyperlipidemia: Secondary | ICD-10-CM | POA: Diagnosis not present

## 2020-04-28 DIAGNOSIS — K219 Gastro-esophageal reflux disease without esophagitis: Secondary | ICD-10-CM | POA: Diagnosis not present

## 2020-06-05 DIAGNOSIS — Z20828 Contact with and (suspected) exposure to other viral communicable diseases: Secondary | ICD-10-CM | POA: Diagnosis not present

## 2020-06-22 DIAGNOSIS — Z23 Encounter for immunization: Secondary | ICD-10-CM | POA: Diagnosis not present

## 2020-07-02 DIAGNOSIS — H401123 Primary open-angle glaucoma, left eye, severe stage: Secondary | ICD-10-CM | POA: Diagnosis not present

## 2020-07-02 DIAGNOSIS — H401111 Primary open-angle glaucoma, right eye, mild stage: Secondary | ICD-10-CM | POA: Diagnosis not present

## 2020-07-14 DIAGNOSIS — L570 Actinic keratosis: Secondary | ICD-10-CM | POA: Diagnosis not present

## 2020-07-14 DIAGNOSIS — L814 Other melanin hyperpigmentation: Secondary | ICD-10-CM | POA: Diagnosis not present

## 2020-07-14 DIAGNOSIS — D485 Neoplasm of uncertain behavior of skin: Secondary | ICD-10-CM | POA: Diagnosis not present

## 2020-07-14 DIAGNOSIS — D0471 Carcinoma in situ of skin of right lower limb, including hip: Secondary | ICD-10-CM | POA: Diagnosis not present

## 2020-07-14 DIAGNOSIS — D2271 Melanocytic nevi of right lower limb, including hip: Secondary | ICD-10-CM | POA: Diagnosis not present

## 2020-07-14 DIAGNOSIS — D224 Melanocytic nevi of scalp and neck: Secondary | ICD-10-CM | POA: Diagnosis not present

## 2020-07-14 DIAGNOSIS — L578 Other skin changes due to chronic exposure to nonionizing radiation: Secondary | ICD-10-CM | POA: Diagnosis not present

## 2020-07-14 DIAGNOSIS — L821 Other seborrheic keratosis: Secondary | ICD-10-CM | POA: Diagnosis not present

## 2020-07-14 DIAGNOSIS — D225 Melanocytic nevi of trunk: Secondary | ICD-10-CM | POA: Diagnosis not present

## 2020-08-04 DIAGNOSIS — C44722 Squamous cell carcinoma of skin of right lower limb, including hip: Secondary | ICD-10-CM | POA: Diagnosis not present

## 2020-11-10 DIAGNOSIS — H401111 Primary open-angle glaucoma, right eye, mild stage: Secondary | ICD-10-CM | POA: Diagnosis not present

## 2020-11-10 DIAGNOSIS — H401123 Primary open-angle glaucoma, left eye, severe stage: Secondary | ICD-10-CM | POA: Diagnosis not present

## 2021-02-11 DIAGNOSIS — H401111 Primary open-angle glaucoma, right eye, mild stage: Secondary | ICD-10-CM | POA: Diagnosis not present

## 2021-02-11 DIAGNOSIS — H401123 Primary open-angle glaucoma, left eye, severe stage: Secondary | ICD-10-CM | POA: Diagnosis not present

## 2021-03-23 DIAGNOSIS — H401111 Primary open-angle glaucoma, right eye, mild stage: Secondary | ICD-10-CM | POA: Diagnosis not present

## 2021-03-23 DIAGNOSIS — H401123 Primary open-angle glaucoma, left eye, severe stage: Secondary | ICD-10-CM | POA: Diagnosis not present

## 2021-04-06 DIAGNOSIS — H353132 Nonexudative age-related macular degeneration, bilateral, intermediate dry stage: Secondary | ICD-10-CM | POA: Diagnosis not present

## 2021-04-08 DIAGNOSIS — Z23 Encounter for immunization: Secondary | ICD-10-CM | POA: Diagnosis not present

## 2021-04-16 ENCOUNTER — Encounter: Payer: Self-pay | Admitting: Internal Medicine

## 2021-04-16 ENCOUNTER — Other Ambulatory Visit: Payer: Self-pay

## 2021-04-16 ENCOUNTER — Ambulatory Visit (INDEPENDENT_AMBULATORY_CARE_PROVIDER_SITE_OTHER): Payer: Medicare Other | Admitting: Internal Medicine

## 2021-04-16 ENCOUNTER — Ambulatory Visit (INDEPENDENT_AMBULATORY_CARE_PROVIDER_SITE_OTHER): Payer: Medicare Other

## 2021-04-16 DIAGNOSIS — R053 Chronic cough: Secondary | ICD-10-CM

## 2021-04-16 HISTORY — DX: Chronic cough: R05.3

## 2021-04-16 MED ORDER — PANTOPRAZOLE SODIUM 40 MG PO TBEC: 40.0000 mg | DELAYED_RELEASE_TABLET | Freq: Every day | ORAL | 2 refills | Status: DC

## 2021-04-16 MED ORDER — FAMOTIDINE 20 MG PO TABS: ORAL_TABLET | ORAL | 11 refills | Status: DC

## 2021-04-16 NOTE — Patient Instructions (Addendum)
Timolol eyedrops are problematic  - see ywhen strarted and if it was about 10 years ago consider replacing with Betoptic   For drainage / throat tickle try take CHLORPHENIRAMINE  4 mg  (Chlortab 4mg   at [employer] should be easiest to find in the green box)  take one every 4 hours as needed - available over the counter- may cause drowsiness so start with just a dose or two an hour before bedtime and see how you tolerate it before trying in daytime    Stop omeprazole and take Pantoprazole (protonix) 40 mg   Take  30-60 min before first meal of the day and Pepcid (famotidine)  20 mg after supper until return to office - this is the best way to tell whether stomach acid is contributing to your problem.    GERD (REFLUX)  is an extremely common cause of respiratory symptoms just like yours , many times with no obvious heartburn at all.    It can be treated with medication, but also with lifestyle changes including elevation of the head of your bed (ideally with 6 -8inch blocks under the headboard of your bed),  Smoking cessation, avoidance of late meals, excessive alcohol, and avoid fatty foods, chocolate, peppermint, colas, red wine, and acidic juices such as orange juice.  NO MINT OR MENTHOL PRODUCTS SO NO COUGH DROPS  USE SUGARLESS CANDY INSTEAD (Jolley ranchers or Stover's or Life Savers) or even ice chips will also do - the key is to swallow to prevent all throat clearing. NO OIL BASED VITAMINS - use powdered substitutes.  Avoid fish oil when coughing.    Please remember to go to the  x-ray department  for your tests - we will call you with the results when they are available    Please schedule a follow up office visit in 6 weeks, call sooner if needed

## 2021-04-16 NOTE — Progress Notes (Signed)
Susan Pena, female    DOB: 02-27-1946,    MRN: [id number]   Brief patient profile:  49 yowf quit smoking 1981 self- referred to pulmonary clinic 04/16/2021  for cough.  Onset with around 2012 daily cough     History of Present Illness  04/16/2021  Pulmonary/ 1st office eval/Eylin Pontarelli  Chief Complaint  Patient presents with   Consult    Patient reports she was told she had acid reflux in the past but endo provider, and feels that she has productive cough,.   Dyspnea:  sometimes cough so bad takes breath away Cough: sporadic seems worse p pm eyedrops for glaucoma lumigan assoc with nasal drainage  Sleep: not waking with it/ HOB 30 degrees in [place] SABA use: none - they don't work Kozlow eval years ago > not helpful  ENT dx reflux  GI did probe neg ? If on ppi or off? But she still senses she's refluxing  No obvious other patterns in day to day or daytime variability or assoc excess/ purulent sputum or mucus plugs or hemoptysis or cp or chest tightness, subjective wheeze or overt sinus  symptoms.   Sleeping  without nocturnal  or early am exacerbation  of respiratory  c/o's or need for noct saba. Also denies any obvious fluctuation of symptoms with weather or environmental changes or other aggravating or alleviating factors except as outlined above   No unusual exposure hx or h/o childhood pna/ asthma or knowledge of premature birth.  Current Allergies, Complete Past Medical History, Past Surgical History, Family History, and Social History were reviewed in [employer] [facility] record.  ROS  The following are not active complaints unless bolded Hoarseness, sore throat, dysphagia, dental problems, itching, sneezing,  nasal congestion or discharge of excess mucus or purulent secretions, ear ache,   fever, chills, sweats, unintended wt loss or wt gain, classically pleuritic or exertional cp,  orthopnea pnd or arm/hand swelling  or leg swelling, presyncope, palpitations,  abdominal pain, anorexia, nausea, vomiting, diarrhea  or change in bowel habits or change in bladder habits, change in stools or change in urine, dysuria, hematuria,  rash, arthralgias, visual complaints, headache, numbness, weakness or ataxia or problems with walking or coordination,  change in mood or  memory.           No past medical history on file.  Outpatient Medications Prior to Visit  Medication Sig Dispense Refill   albuterol (VENTOLIN HFA) 108 (90 Base) MCG/ACT inhaler Inhale into the lungs.     azelastine (ASTELIN) 0.1 % nasal spray U 2 SPRAYS IEN BID     brimonidine-timolol (COMBIGAN) 0.2-0.5 % ophthalmic solution SMARTSIG:2 Drop(s) In Eye(s) Every 12 Hours     brinzolamide (AZOPT) 1 % ophthalmic suspension INT 1-2 DROPS INTO THE LEFT EYE BID     escitalopram (LEXAPRO) 10 MG tablet Take 10 mg by mouth daily.     LUMIGAN 0.01 % SOLN 1 drop at bedtime.     montelukast (SINGULAIR) 10 MG tablet Take 1 tablet by mouth as needed.     valACYclovir (VALTREX) 500 MG tablet Take 500 mg by mouth daily. (Patient not taking: Reported on 04/16/2021)     No facility-administered medications prior to visit.     Objective:     BP 100/60 (BP Location: Left Arm, Patient Position: Sitting, Cuff Size: Normal)   Pulse 65   Temp 97.7 F (36.5 C) (Oral)   SpO2 99% Comment: ra  SpO2: 99 % (ra)  Amb wf  nad   HEENT : no cobblestoning on excess pnd                                 NECK :  without JVD/Nodes/TM/ nl carotid upstrokes bilaterally   LUNGS: no acc muscle use,  Nl contour chest which is clear to A and P bilaterally with some urge to cough @ end exp exp maneuver   CV:  RRR  no s3 or murmur or increase in P2, and no edema   ABD:  soft and nontender with nl inspiratory excursion in the supine position. No bruits or organomegaly appreciated, bowel sounds nl  MS:  Nl gait/ ext warm without deformities, calf tenderness, cyanosis or clubbing No obvious joint restrictions   SKIN:  warm and dry without lesions    NEURO:  alert, approp, nl sensorium with  no motor or cerebellar deficits apparent.    CXR PA and Lateral:   04/16/2021 :    I personally reviewed images / radiology impression as follows:    Min non specific increase markings/ no acute dz   Assessment   Chronic cough Onset 2012  Daily cough ? p started timolol eyedrops ?  -  GI probe "neg GERD"  Per [facility] ? While on ppi ?  -  04/16/2021 max gerd rx plus 1st gen H1 blockers per guidelines   The most common causes of chronic cough in immunocompetent adults include the following: upper airway cough syndrome (UACS), previously referred to as postnasal drip syndrome (PNDS), which is caused by variety of rhinosinus conditions; (2) asthma; (3) GERD; (4) chronic bronchitis from cigarette smoking or other inhaled environmental irritants; (5) nonasthmatic eosinophilic bronchitis; and (6) bronchiectasis.   These conditions, singly or in combination, have accounted for up to 94% of the causes of chronic cough in prospective studies.   Other conditions have constituted no >6% of the causes in prospective studies These have included bronchogenic carcinoma, chronic interstitial pneumonia, sarcoidosis, left ventricular failure, ACEI-induced cough, and aspiration from a condition associated with pharyngeal dysfunction.    Chronic cough is often simultaneously caused by more than one condition. A single cause has been found from 38 to 82% of the time, multiple causes from 18 to 62%. Multiply caused cough has been the result of three diseases up to 42% of the time.   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   Have asked her to have opth consider change to betoptic and in meantime start rx  With max rx directed at acid and non acid gerd and 1st gen H1  blockers per guidelines  Then regroup in 6 weeks  Advised : The standardized cough guidelines published in Chest by Lissa Morales in 2006 are still the best available and consist of a multiple step process (up to 12!) , not a single office visit,  and are intended  to address this problem logically,  with an alogrithm dependent on response to empiric treatment at  each progressive step  to determine a specific diagnosis with  minimal addtional testing needed. Therefore if adherence is an issue or can't be accurately verified,  it's very unlikely the standard evaluation and treatment will be successful here.    Furthermore, response to therapy (other  than acute cough suppression, which should only be used short term with avoidance of narcotic containing cough syrups if possible), can be a gradual process for which the patient is not likely to  perceive immediate benefit.  Unlike going to an eye doctor where the best perscription is almost always the first one and is immediately effective, this is almost never the case in the management of chronic cough syndromes. Therefore the patient needs to commit up front to consistently adhere to recommendations  for up to 6 weeks of therapy directed at the likely underlying problem(s) before the response can be reasonably evaluated.          Each maintenance medication was reviewed in detail including emphasizing most importantly the difference between maintenance and prns and under what circumstances the prns are to be triggered using an action plan format where appropriate.  Total time for H and P, chart review, counseling, reviewing   and generating customized AVS unique to this office visit / same day charting > 45 min              Christinia Gully, MD 04/16/2021

## 2021-04-17 ENCOUNTER — Encounter: Payer: Self-pay | Admitting: Internal Medicine

## 2021-04-17 NOTE — Assessment & Plan Note (Signed)
Onset 2012  Daily cough ? p started timolol eyedrops ?  -  GI probe "neg GERD"  Per [facility] ? While on ppi ?  -  04/16/2021 max gerd rx plus 1st gen H1 blockers per guidelines   The most common causes of chronic cough in immunocompetent adults include the following: upper airway cough syndrome (UACS), previously referred to as postnasal drip syndrome (PNDS), which is caused by variety of rhinosinus conditions; (2) asthma; (3) GERD; (4) chronic bronchitis from cigarette smoking or other inhaled environmental irritants; (5) nonasthmatic eosinophilic bronchitis; and (6) bronchiectasis.   These conditions, singly or in combination, have accounted for up to 94% of the causes of chronic cough in prospective studies.   Other conditions have constituted no >6% of the causes in prospective studies These have included bronchogenic carcinoma, chronic interstitial pneumonia, sarcoidosis, left ventricular failure, ACEI-induced cough, and aspiration from a condition associated with pharyngeal dysfunction.    Chronic cough is often simultaneously caused by more than one condition. A single cause has been found from 38 to 82% of the time, multiple causes from 18 to 62%. Multiply caused cough has been the result of three diseases up to 42% of the time.   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   Have asked her to have opth consider change to betoptic and in meantime start rx  With max rx directed at acid and non acid gerd and 1st gen H1 blockers per guidelines  Then regroup in 6 weeks  Advised : The standardized cough guidelines published in Chest by Lissa Morales in 2006 are still the best available and consist of a multiple step process (up to 12!) , not a single office visit,  and are intended  to address this problem logically,  with an  alogrithm dependent on response to empiric treatment at  each progressive step  to determine a specific diagnosis with  minimal addtional testing needed. Therefore if adherence is an issue or can't be accurately verified,  it's very unlikely the standard evaluation and treatment will be successful here.    Furthermore, response to therapy (other than acute cough suppression, which should only be used short term with avoidance of narcotic containing cough syrups if possible), can be a gradual process for which the patient is not likely to  perceive immediate benefit.  Unlike going to an eye doctor where the best perscription is almost always the first one and is immediately effective, this is almost never the case in the management of chronic cough syndromes. Therefore the patient needs to commit up front to consistently adhere to recommendations  for up to 6 weeks of therapy directed at the likely underlying problem(s) before the response can be reasonably evaluated.          Each maintenance medication was reviewed in detail including emphasizing most importantly the difference between maintenance and prns and under what circumstances the prns are to be triggered using an action plan format where appropriate.  Total time for H and P, chart review, counseling, reviewing   and generating customized AVS unique to this office visit / same day charting > 45 min

## 2021-04-27 DIAGNOSIS — H401111 Primary open-angle glaucoma, right eye, mild stage: Secondary | ICD-10-CM | POA: Diagnosis not present

## 2021-04-27 DIAGNOSIS — H401123 Primary open-angle glaucoma, left eye, severe stage: Secondary | ICD-10-CM | POA: Diagnosis not present

## 2021-05-03 DIAGNOSIS — Z79899 Other long term (current) drug therapy: Secondary | ICD-10-CM | POA: Diagnosis not present

## 2021-05-03 DIAGNOSIS — E7800 Pure hypercholesterolemia, unspecified: Secondary | ICD-10-CM | POA: Diagnosis not present

## 2021-05-04 DIAGNOSIS — Z6827 Body mass index (BMI) 27.0-27.9, adult: Secondary | ICD-10-CM | POA: Diagnosis not present

## 2021-05-04 DIAGNOSIS — E7800 Pure hypercholesterolemia, unspecified: Secondary | ICD-10-CM | POA: Diagnosis not present

## 2021-05-04 DIAGNOSIS — E663 Overweight: Secondary | ICD-10-CM | POA: Diagnosis not present

## 2021-05-04 DIAGNOSIS — K219 Gastro-esophageal reflux disease without esophagitis: Secondary | ICD-10-CM | POA: Diagnosis not present

## 2021-05-04 DIAGNOSIS — Z0000 Encounter for general adult medical examination without abnormal findings: Secondary | ICD-10-CM | POA: Diagnosis not present

## 2021-05-04 DIAGNOSIS — F324 Major depressive disorder, single episode, in partial remission: Secondary | ICD-10-CM | POA: Diagnosis not present

## 2021-05-11 DIAGNOSIS — H401111 Primary open-angle glaucoma, right eye, mild stage: Secondary | ICD-10-CM | POA: Diagnosis not present

## 2021-05-11 DIAGNOSIS — H401123 Primary open-angle glaucoma, left eye, severe stage: Secondary | ICD-10-CM | POA: Diagnosis not present

## 2021-05-16 DIAGNOSIS — R058 Other specified cough: Secondary | ICD-10-CM | POA: Diagnosis not present

## 2021-05-16 DIAGNOSIS — J101 Influenza due to other identified influenza virus with other respiratory manifestations: Secondary | ICD-10-CM | POA: Diagnosis not present

## 2021-05-19 DIAGNOSIS — H401123 Primary open-angle glaucoma, left eye, severe stage: Secondary | ICD-10-CM | POA: Diagnosis not present

## 2021-05-28 DIAGNOSIS — M85852 Other specified disorders of bone density and structure, left thigh: Secondary | ICD-10-CM | POA: Diagnosis not present

## 2021-05-28 DIAGNOSIS — Z1231 Encounter for screening mammogram for malignant neoplasm of breast: Secondary | ICD-10-CM | POA: Diagnosis not present

## 2021-05-28 DIAGNOSIS — M85851 Other specified disorders of bone density and structure, right thigh: Secondary | ICD-10-CM | POA: Diagnosis not present

## 2021-06-03 ENCOUNTER — Ambulatory Visit (INDEPENDENT_AMBULATORY_CARE_PROVIDER_SITE_OTHER): Payer: Medicare Other | Admitting: Internal Medicine

## 2021-06-03 ENCOUNTER — Other Ambulatory Visit: Payer: Self-pay

## 2021-06-03 ENCOUNTER — Encounter: Payer: Self-pay | Admitting: Internal Medicine

## 2021-06-03 DIAGNOSIS — R053 Chronic cough: Secondary | ICD-10-CM | POA: Diagnosis not present

## 2021-06-03 LAB — CBC WITH DIFFERENTIAL/PLATELET
Basophils Absolute: 0.1 10*3/uL (ref 0.0–0.1)
Basophils Relative: 1.2 % (ref 0.0–3.0)
Eosinophils Absolute: 0.3 10*3/uL (ref 0.0–0.7)
Eosinophils Relative: 4.9 % (ref 0.0–5.0)
HCT: 42.6 % (ref 36.0–46.0)
Hemoglobin: 13.8 g/dL (ref 12.0–15.0)
Lymphocytes Relative: 25.7 % (ref 12.0–46.0)
Lymphs Abs: 1.6 10*3/uL (ref 0.7–4.0)
MCHC: 32.4 g/dL (ref 30.0–36.0)
MCV: 89.5 fl (ref 78.0–100.0)
Monocytes Absolute: 0.5 10*3/uL (ref 0.1–1.0)
Monocytes Relative: 8.3 % (ref 3.0–12.0)
Neutro Abs: 3.8 10*3/uL (ref 1.4–7.7)
Neutrophils Relative %: 59.9 % (ref 43.0–77.0)
Platelets: 215 10*3/uL (ref 150.0–400.0)
RBC: 4.76 Mil/uL (ref 3.87–5.11)
RDW: 12.9 % (ref 11.5–15.5)
WBC: 6.3 10*3/uL (ref 4.0–10.5)

## 2021-06-03 NOTE — Progress Notes (Signed)
Susan Pena, female    DOB: 12-Oct-1945     MRN: [id number]   Brief patient profile:  11 yowf quit smoking 1981 self- referred to pulmonary clinic 04/16/2021  for cough.  Onset with around 2012 daily cough     History of Present Illness  04/16/2021  Pulmonary/ 1st office eval/Neftaly Swiss  Chief Complaint  Patient presents with   Consult    Patient reports she was told she had acid reflux in the past but endo provider, and feels that she has productive cough,.   Dyspnea:  sometimes cough so bad takes breath away Cough: sporadic seems worse p pm eyedrops for glaucoma lumigan assoc with nasal drainage  Sleep: not waking with it/ HOB 30 degrees in [place] SABA use: none - they don't work Kozlow eval years ago > not helpful  ENT dx reflux  GI did probe neg ? If on ppi or off? But she still senses she's refluxing Rec Timolol eyedrops are problematic  - see when strarted and if it was about 10 years ago consider replacing with Betoptic  For drainage / throat tickle try take CHLORPHENIRAMINE  4 mg  Stop omeprazole and take Pantoprazole (protonix) 40 mg   Take  30-60 min before first meal of the day and Pepcid (famotidine)  20 mg after supper until return to office  GERD diet reviewed, bed blocks rec  Cxr  : ok       06/03/2021  f/u ov/Tansy Lorek re:  cough x 2012  maint on singulair,  ppi ac and pepcid q hs   Chief Complaint  Patient presents with   Follow-up    Dx with influenza approx 3 wks ago and cough got slightly worse then. Cough is prod with thick, clear sputum.     Dyspnea:  breathing fine as long as not coughing  Cough: non prod, constant daytime improves with candy the best  Sleeping: no noct awakening  SABA use: none 02: none  Covid status:   vax x 3    No obvious day to day or daytime variability or assoc excess/ purulent sputum or mucus plugs or hemoptysis or cp or chest tightness, subjective wheeze or overt sinus or hb symptoms.   Sleeping as above  without nocturnal  or  early am exacerbation  of respiratory  c/o's or need for noct saba. Also denies any obvious fluctuation of symptoms with weather or environmental changes or other aggravating or alleviating factors except as outlined above   No unusual exposure hx or h/o childhood pna/ asthma or knowledge of premature birth.  Current Allergies, Complete Past Medical History, Past Surgical History, Family History, and Social History were reviewed in [employer] record.  ROS  The following are not active complaints unless bolded Hoarseness, sore throat, dysphagia, dental problems, itching, sneezing,  nasal congestion or discharge of excess mucus or purulent secretions, ear ache,   fever, chills, sweats, unintended wt loss or wt gain, classically pleuritic or exertional cp,  orthopnea pnd or arm/hand swelling  or leg swelling, presyncope, palpitations, abdominal pain, anorexia, nausea, vomiting, diarrhea  or change in bowel habits or change in bladder habits, change in stools or change in urine, dysuria, hematuria,  rash, arthralgias, visual complaints, headache, numbness, weakness or ataxia or problems with walking or coordination,  change in mood or  memory.        Current Meds  Medication Sig   brimonidine-timolol (COMBIGAN) 0.2-0.5 % ophthalmic solution SMARTSIG:2 Drop(s) In Eye(s) Every 12 Hours  dorzolamide (TRUSOPT) 2 % ophthalmic solution 1 drop 2 (two) times daily.   famotidine (PEPCID) 20 MG tablet One after supper   fexofenadine (ALLEGRA) 180 MG tablet Take 0.5 mg by mouth in the morning and at bedtime.   LUMIGAN 0.01 % SOLN 1 drop at bedtime.   montelukast (SINGULAIR) 10 MG tablet Take 1 tablet by mouth as needed.   pantoprazole (PROTONIX) 40 MG tablet Take 1 tablet (40 mg total) by mouth daily. Take 30-60 min before first meal of the day           Objective:     Wt Readings from Last 3 Encounters:  06/03/21 147 lb (66.7 kg)      Vital signs reviewed  06/03/2021  - Note  at rest 02 sats  99% on RA   General appearance:    amb pleasant wf / freq throa clearing   HEENT : orophx is pristine    NECK :  without JVD/Nodes/TM/ nl carotid upstrokes bilaterally   LUNGS: no acc muscle use,  Nl contour chest which is clear to A and P bilaterally without cough on insp or exp maneuvers   CV:  RRR  no s3 or murmur or increase in P2, and no edema   ABD:  soft and nontender with nl inspiratory excursion in the supine position. No bruits or organomegaly appreciated, bowel sounds nl  MS:  Nl gait/ ext warm without deformities, calf tenderness, cyanosis or clubbing No obvious joint restrictions   SKIN: warm and dry without lesions    NEURO:  alert, approp, nl sensorium with  no motor or cerebellar deficits apparent.        Assessment

## 2021-06-03 NOTE — Patient Instructions (Signed)
Please remember to go to the lab department   for your tests - we will call you with the results when they are available.      If not better better after the holidays please call for referral to ENT at [facility] or [facility] for upper airway cough syndrome

## 2021-06-04 ENCOUNTER — Encounter: Payer: Self-pay | Admitting: Internal Medicine

## 2021-06-04 LAB — IGE: IgE (Immunoglobulin E), Serum: 34 kU/L (ref <=114)

## 2021-06-04 NOTE — Assessment & Plan Note (Signed)
Onset 2012  Daily cough ? p started timolol eyedrops ? > no better off them for a week as of ov 06/03/2021  -  GI probe "neg GERD"  Per [facility] ? While on ppi ?  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   These are the same pts (now being labeled as having "irritable larynx syndrome" by some cough centers) who not infrequently have a history of having failed to tolerate ace inhibitors,  dry powder inhalers or biphosphonates or report having atypical/extraesophageal reflux symptoms that don't respond to standard doses of PPI  and are easily confused as having aecopd or asthma flares by even experienced allergists/ pulmonologists (myself included).   If she can't take 1st gen H1 blockers per guidelines  Even on a trial basis (will check with ophth) then next best option is titrating course of gabapentin vs ent eval and she chose the latter so will regroup with me p  The new year.        Each maintenance medication was reviewed in detail including emphasizing most importantly the difference between maintenance and prns and under what circumstances the prns are to be triggered using an action plan format where appropriate.  Total time for H and P, chart review, counseling, and generating  customized AVS unique to this office visit / same day charting = 101min

## 2021-07-13 DIAGNOSIS — H401111 Primary open-angle glaucoma, right eye, mild stage: Secondary | ICD-10-CM | POA: Diagnosis not present

## 2021-07-13 DIAGNOSIS — H401123 Primary open-angle glaucoma, left eye, severe stage: Secondary | ICD-10-CM | POA: Diagnosis not present

## 2021-07-21 DIAGNOSIS — D224 Melanocytic nevi of scalp and neck: Secondary | ICD-10-CM | POA: Diagnosis not present

## 2021-07-21 DIAGNOSIS — Z23 Encounter for immunization: Secondary | ICD-10-CM | POA: Diagnosis not present

## 2021-07-21 DIAGNOSIS — D225 Melanocytic nevi of trunk: Secondary | ICD-10-CM | POA: Diagnosis not present

## 2021-07-21 DIAGNOSIS — L814 Other melanin hyperpigmentation: Secondary | ICD-10-CM | POA: Diagnosis not present

## 2021-07-21 DIAGNOSIS — L821 Other seborrheic keratosis: Secondary | ICD-10-CM | POA: Diagnosis not present

## 2021-07-21 DIAGNOSIS — L570 Actinic keratosis: Secondary | ICD-10-CM | POA: Diagnosis not present

## 2021-07-21 DIAGNOSIS — L820 Inflamed seborrheic keratosis: Secondary | ICD-10-CM | POA: Diagnosis not present

## 2021-07-21 DIAGNOSIS — D2271 Melanocytic nevi of right lower limb, including hip: Secondary | ICD-10-CM | POA: Diagnosis not present

## 2021-07-21 DIAGNOSIS — L578 Other skin changes due to chronic exposure to nonionizing radiation: Secondary | ICD-10-CM | POA: Diagnosis not present

## 2021-07-23 DIAGNOSIS — J209 Acute bronchitis, unspecified: Secondary | ICD-10-CM | POA: Diagnosis not present

## 2021-07-23 DIAGNOSIS — R051 Acute cough: Secondary | ICD-10-CM | POA: Diagnosis not present

## 2021-08-24 DIAGNOSIS — Z1212 Encounter for screening for malignant neoplasm of rectum: Secondary | ICD-10-CM | POA: Diagnosis not present

## 2021-08-24 DIAGNOSIS — Z1211 Encounter for screening for malignant neoplasm of colon: Secondary | ICD-10-CM | POA: Diagnosis not present

## 2021-09-15 DIAGNOSIS — R053 Chronic cough: Secondary | ICD-10-CM | POA: Diagnosis not present

## 2021-09-15 DIAGNOSIS — F329 Major depressive disorder, single episode, unspecified: Secondary | ICD-10-CM | POA: Diagnosis not present

## 2021-09-15 DIAGNOSIS — N3281 Overactive bladder: Secondary | ICD-10-CM | POA: Diagnosis not present

## 2021-10-09 DIAGNOSIS — T7840XA Allergy, unspecified, initial encounter: Secondary | ICD-10-CM | POA: Diagnosis not present

## 2021-10-09 DIAGNOSIS — R21 Rash and other nonspecific skin eruption: Secondary | ICD-10-CM | POA: Diagnosis not present

## 2021-10-11 DIAGNOSIS — R6884 Jaw pain: Secondary | ICD-10-CM | POA: Diagnosis not present

## 2021-10-14 DIAGNOSIS — H401123 Primary open-angle glaucoma, left eye, severe stage: Secondary | ICD-10-CM | POA: Diagnosis not present

## 2021-10-14 DIAGNOSIS — H401111 Primary open-angle glaucoma, right eye, mild stage: Secondary | ICD-10-CM | POA: Diagnosis not present

## 2021-12-06 DIAGNOSIS — R42 Dizziness and giddiness: Secondary | ICD-10-CM | POA: Diagnosis not present

## 2021-12-06 DIAGNOSIS — H8110 Benign paroxysmal vertigo, unspecified ear: Secondary | ICD-10-CM | POA: Diagnosis not present

## 2021-12-06 DIAGNOSIS — H938X9 Other specified disorders of ear, unspecified ear: Secondary | ICD-10-CM | POA: Diagnosis not present

## 2021-12-06 DIAGNOSIS — H9319 Tinnitus, unspecified ear: Secondary | ICD-10-CM | POA: Diagnosis not present

## 2021-12-06 DIAGNOSIS — K115 Sialolithiasis: Secondary | ICD-10-CM | POA: Diagnosis not present

## 2022-01-18 DIAGNOSIS — H401123 Primary open-angle glaucoma, left eye, severe stage: Secondary | ICD-10-CM | POA: Diagnosis not present

## 2022-01-18 DIAGNOSIS — H401111 Primary open-angle glaucoma, right eye, mild stage: Secondary | ICD-10-CM | POA: Diagnosis not present

## 2022-02-20 IMAGING — DX DG CHEST 2V
2 series · 2 of 2 positions shown · non-contrast
Comparison: Chest radiograph dated 09/16/2018

CLINICAL DATA: Chronic cough

EXAM:
CHEST - 2 VIEW

[chest pa]
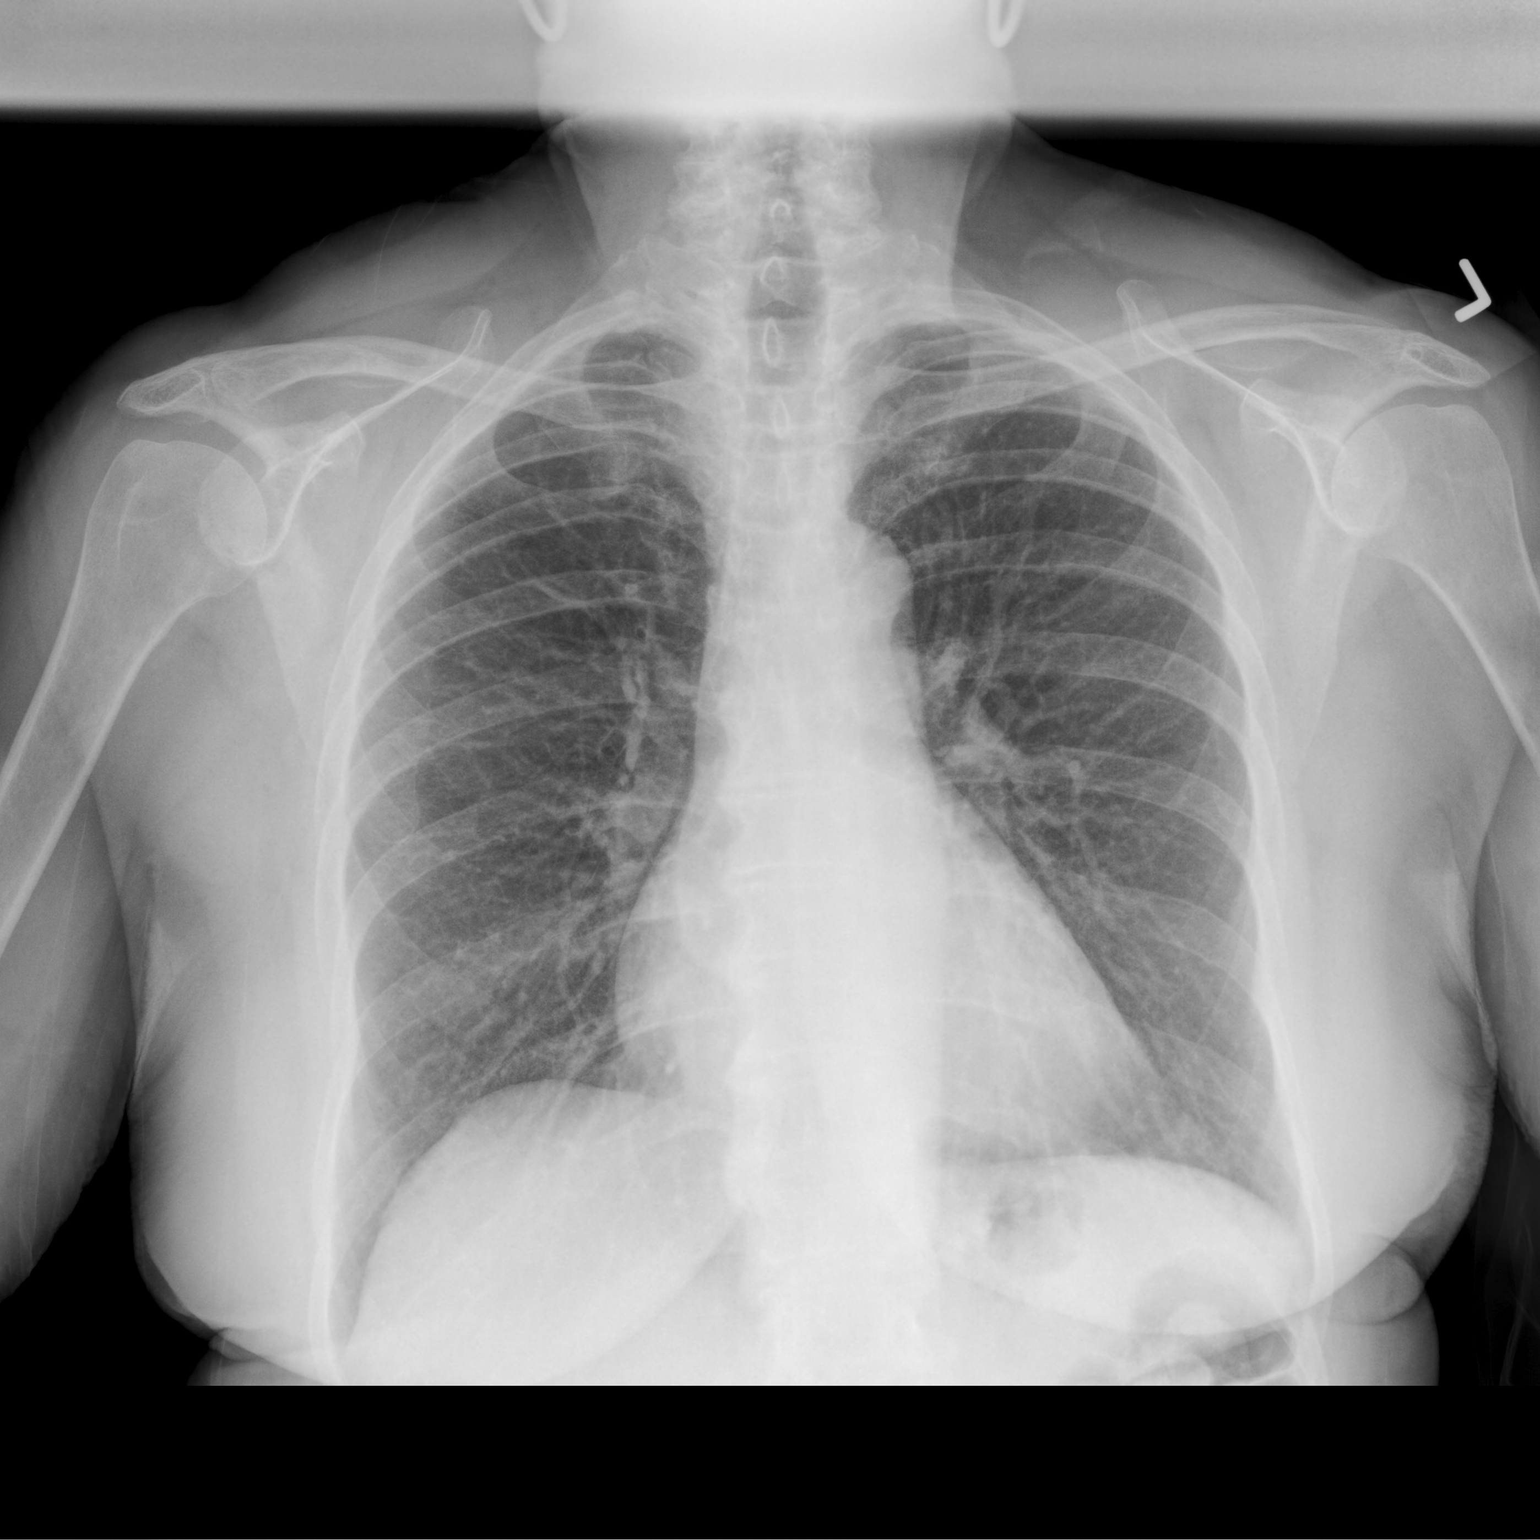

[chest lat]
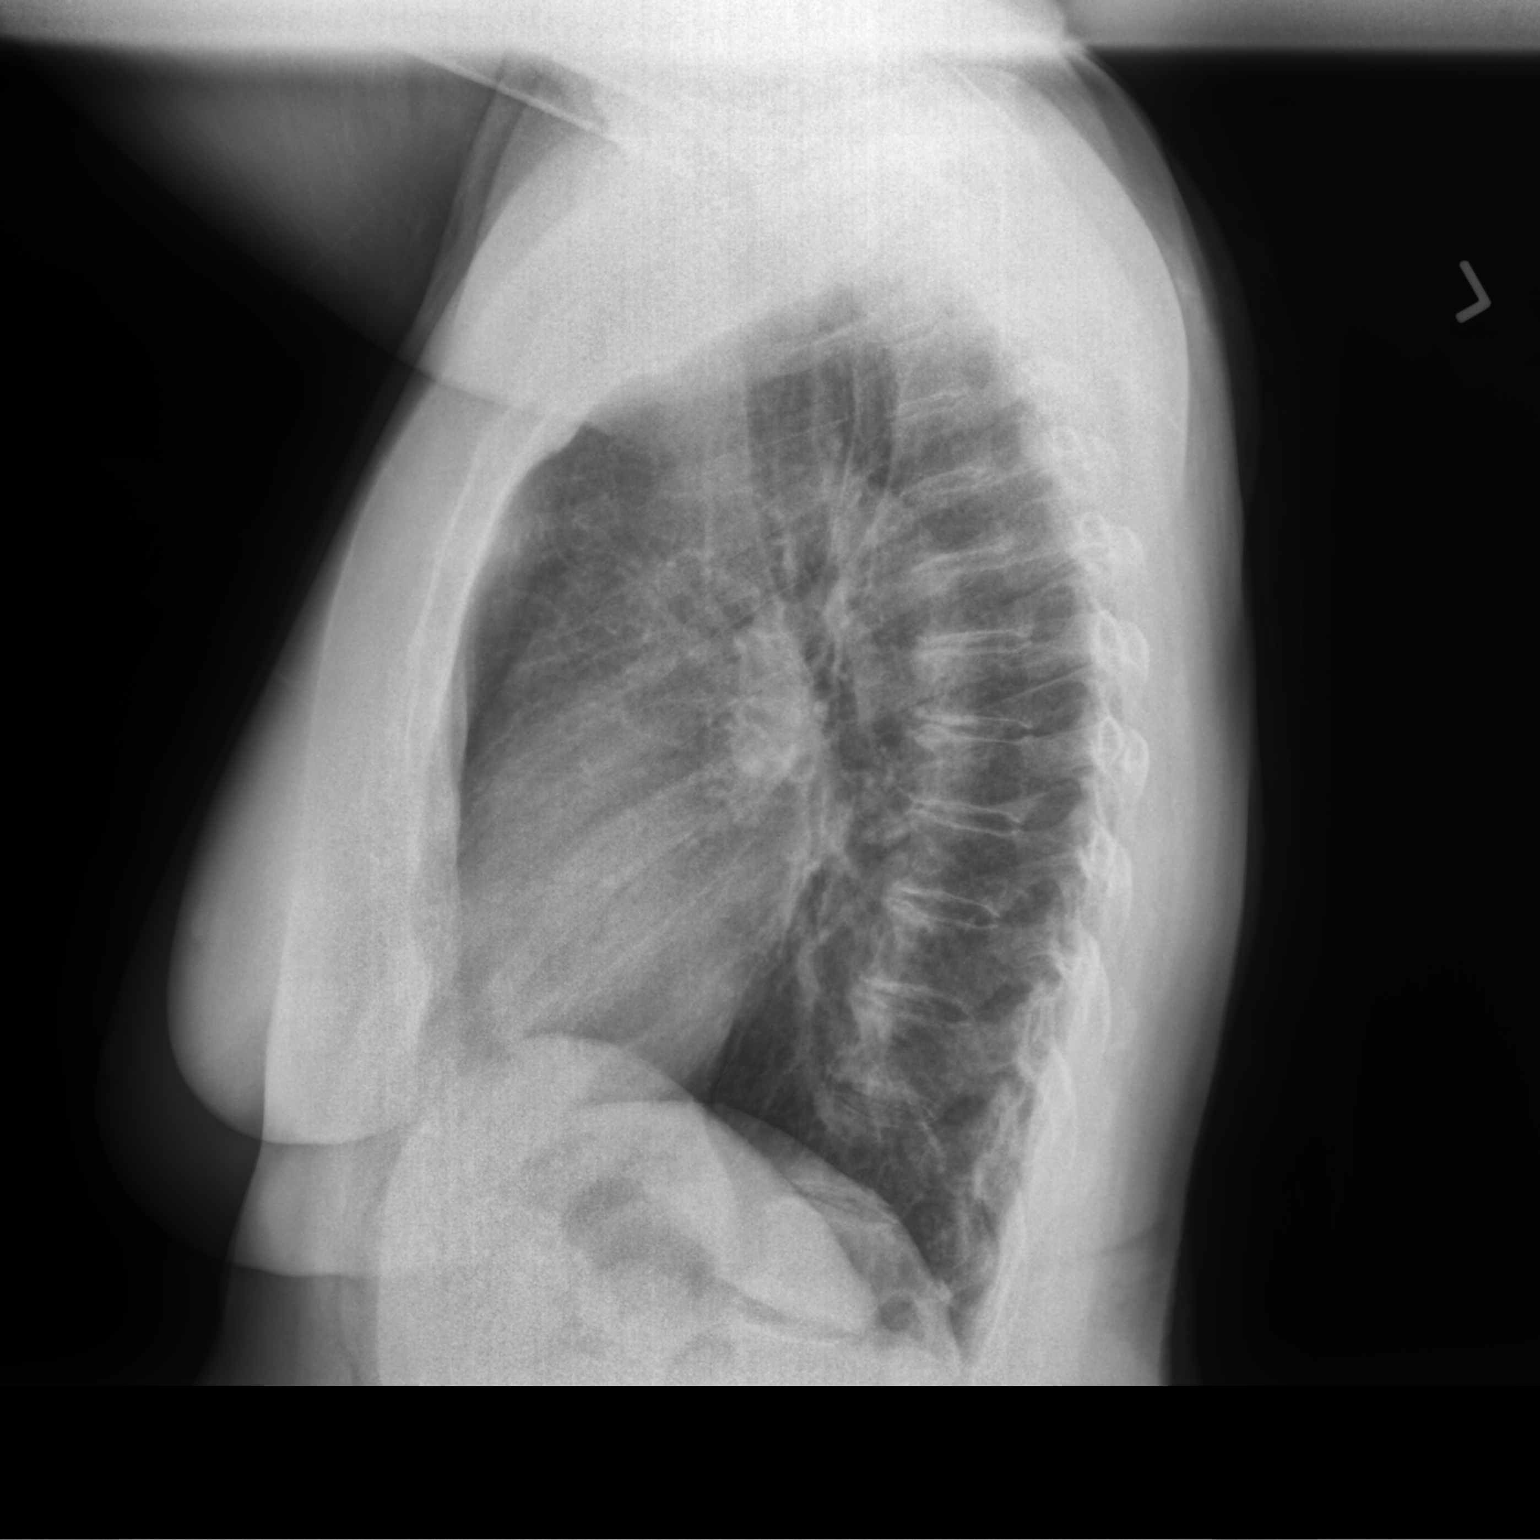

[2 of 2 positions shown; findings below may reference images not displayed]

FINDINGS: The heart size and mediastinal contours are within normal limits.
Both lungs are clear. Degenerative changes are seen in the spine.
IMPRESSION: No active cardiopulmonary disease.

## 2022-04-11 DIAGNOSIS — H353132 Nonexudative age-related macular degeneration, bilateral, intermediate dry stage: Secondary | ICD-10-CM | POA: Diagnosis not present

## 2022-04-19 DIAGNOSIS — H401111 Primary open-angle glaucoma, right eye, mild stage: Secondary | ICD-10-CM | POA: Diagnosis not present

## 2022-04-19 DIAGNOSIS — H401123 Primary open-angle glaucoma, left eye, severe stage: Secondary | ICD-10-CM | POA: Diagnosis not present

## 2022-04-30 DIAGNOSIS — Z23 Encounter for immunization: Secondary | ICD-10-CM | POA: Diagnosis not present

## 2022-05-06 DIAGNOSIS — H353132 Nonexudative age-related macular degeneration, bilateral, intermediate dry stage: Secondary | ICD-10-CM | POA: Diagnosis not present

## 2022-05-06 DIAGNOSIS — Z736 Limitation of activities due to disability: Secondary | ICD-10-CM | POA: Diagnosis not present

## 2022-05-06 DIAGNOSIS — H5372 Impaired contrast sensitivity: Secondary | ICD-10-CM | POA: Diagnosis not present

## 2022-05-06 DIAGNOSIS — H401123 Primary open-angle glaucoma, left eye, severe stage: Secondary | ICD-10-CM | POA: Diagnosis not present

## 2022-05-06 DIAGNOSIS — H401111 Primary open-angle glaucoma, right eye, mild stage: Secondary | ICD-10-CM | POA: Diagnosis not present

## 2022-05-06 DIAGNOSIS — H539 Unspecified visual disturbance: Secondary | ICD-10-CM | POA: Diagnosis not present

## 2022-05-06 DIAGNOSIS — H53412 Scotoma involving central area, left eye: Secondary | ICD-10-CM | POA: Diagnosis not present

## 2022-05-26 DIAGNOSIS — H401123 Primary open-angle glaucoma, left eye, severe stage: Secondary | ICD-10-CM | POA: Diagnosis not present

## 2022-05-26 DIAGNOSIS — H401111 Primary open-angle glaucoma, right eye, mild stage: Secondary | ICD-10-CM | POA: Diagnosis not present

## 2022-06-01 DIAGNOSIS — Z1231 Encounter for screening mammogram for malignant neoplasm of breast: Secondary | ICD-10-CM | POA: Diagnosis not present

## 2022-06-06 DIAGNOSIS — B001 Herpesviral vesicular dermatitis: Secondary | ICD-10-CM | POA: Diagnosis not present

## 2022-06-06 DIAGNOSIS — D485 Neoplasm of uncertain behavior of skin: Secondary | ICD-10-CM | POA: Diagnosis not present

## 2022-06-06 DIAGNOSIS — C44622 Squamous cell carcinoma of skin of right upper limb, including shoulder: Secondary | ICD-10-CM | POA: Diagnosis not present

## 2022-06-30 DIAGNOSIS — C44612 Basal cell carcinoma of skin of right upper limb, including shoulder: Secondary | ICD-10-CM | POA: Diagnosis not present

## 2022-06-30 DIAGNOSIS — C44622 Squamous cell carcinoma of skin of right upper limb, including shoulder: Secondary | ICD-10-CM | POA: Diagnosis not present

## 2022-06-30 DIAGNOSIS — L820 Inflamed seborrheic keratosis: Secondary | ICD-10-CM | POA: Diagnosis not present

## 2022-07-26 DIAGNOSIS — L814 Other melanin hyperpigmentation: Secondary | ICD-10-CM | POA: Diagnosis not present

## 2022-07-26 DIAGNOSIS — L578 Other skin changes due to chronic exposure to nonionizing radiation: Secondary | ICD-10-CM | POA: Diagnosis not present

## 2022-07-26 DIAGNOSIS — L821 Other seborrheic keratosis: Secondary | ICD-10-CM | POA: Diagnosis not present

## 2022-07-26 DIAGNOSIS — D2271 Melanocytic nevi of right lower limb, including hip: Secondary | ICD-10-CM | POA: Diagnosis not present

## 2022-07-26 DIAGNOSIS — D225 Melanocytic nevi of trunk: Secondary | ICD-10-CM | POA: Diagnosis not present

## 2022-07-26 DIAGNOSIS — L570 Actinic keratosis: Secondary | ICD-10-CM | POA: Diagnosis not present

## 2022-07-26 DIAGNOSIS — D224 Melanocytic nevi of scalp and neck: Secondary | ICD-10-CM | POA: Diagnosis not present

## 2022-08-25 DIAGNOSIS — H401123 Primary open-angle glaucoma, left eye, severe stage: Secondary | ICD-10-CM | POA: Diagnosis not present

## 2022-08-25 DIAGNOSIS — H401111 Primary open-angle glaucoma, right eye, mild stage: Secondary | ICD-10-CM | POA: Diagnosis not present

## 2022-11-24 DIAGNOSIS — Z79899 Other long term (current) drug therapy: Secondary | ICD-10-CM | POA: Diagnosis not present

## 2022-11-24 DIAGNOSIS — Z0000 Encounter for general adult medical examination without abnormal findings: Secondary | ICD-10-CM | POA: Diagnosis not present

## 2022-11-24 DIAGNOSIS — Z1389 Encounter for screening for other disorder: Secondary | ICD-10-CM | POA: Diagnosis not present

## 2022-11-24 DIAGNOSIS — E663 Overweight: Secondary | ICD-10-CM | POA: Diagnosis not present

## 2022-11-24 DIAGNOSIS — Z6827 Body mass index (BMI) 27.0-27.9, adult: Secondary | ICD-10-CM | POA: Diagnosis not present

## 2022-12-22 DIAGNOSIS — H401111 Primary open-angle glaucoma, right eye, mild stage: Secondary | ICD-10-CM | POA: Diagnosis not present

## 2022-12-22 DIAGNOSIS — H401123 Primary open-angle glaucoma, left eye, severe stage: Secondary | ICD-10-CM | POA: Diagnosis not present

## 2023-01-31 ENCOUNTER — Other Ambulatory Visit: Payer: Self-pay

## 2023-01-31 DIAGNOSIS — E663 Overweight: Secondary | ICD-10-CM | POA: Insufficient documentation

## 2023-01-31 DIAGNOSIS — Z6827 Body mass index (BMI) 27.0-27.9, adult: Secondary | ICD-10-CM | POA: Insufficient documentation

## 2023-02-15 NOTE — Progress Notes (Unsigned)
Cardiology Office Note:    Date:  02/16/2023   ID:  Susan Pena, DOB 1946/02/10, MRN [id number]  PCP:  Alinda Deem, MD  Cardiologist:  Norman Herrlich, MD   Referring MD: Karilyn Cota, NP  ASSESSMENT:    1. Chest pain of uncertain etiology   2. SOB (shortness of breath) on exertion   3. Bradycardia    PLAN:    In order of problems listed above:  She has developed exercise symptoms of chest tightness shortness of breath differential diagnosis CAD versus cardiomyopathy versus bradycardia with resting heart rates as low as the 40s. Further evaluation with 1 week event monitor echocardiogram and cardiac CTA She request to have a vascular screening study scheduled in our office For now continue her beta-blocker eyedrops depending on results of her monitor  Next appointment 3 months   Medication Adjustments/Labs and Tests Ordered: Current medicines are reviewed at length with the patient today.  Concerns regarding medicines are outlined above.  Orders Placed This Encounter  Procedures   EKG 12-Lead   No orders of the defined types were placed in this encounter.    Chief Complaint  Patient presents with   Shortness of Breath    History of Present Illness:    Susan Pena is a 77 y.o. female with hyperlipidemia who is being seen today for cardiology evaluation at the request of Wilburn, Doy Mince, NP.  Recent labs with a cholesterol 217 LDL 137 triglycerides 122 HDL 56.  I recognize her as I cared for her  husband in the past She is in the process of building a home [place] and is a stressful time in her life Recently when she walks the dog and goes uphill she gets breathless gets tightness in her chest is better when she will slow down or stop and can last up to 10 minutes and is very predictable in nature It is not occurred at rest and does not awaken her from her sleep He has a long history of relatively slow heart rate and she takes beta-blocker  eyedrops I did not have records but we are able to access from 2020 when she was at [facility] with a bowel obstruction.  She thought there were cardiac complications in that but I looked through the discharge summary there is no mention of any cardiology problems. She has no known history of congenital rheumatic heart disease She tells me her heart rate can be in the mid 40s on an Apple Watch but has not set it up for rate detection high or low rate limits or atrial fibrillation She has no known history of lung disease no cough or wheezing No edema orthopnea palpitation or syncope  Past Medical History:  Diagnosis Date   BMI 27.0-27.9,adult    Chronic cough 04/16/2021   Onset 2012  Daily cough ? p started timolol eyedrops ? > no better off them for a week as of ov 06/03/2021   -  GI probe "neg GERD"  Per [facility] ? While on ppi ?   -  04/16/2021 max gerd rx plus 1st gen H1 blockers per guidelines  > not able to take H1 per pharmacy, did not check with eye doctor  -Allergy profile 06/03/2021 >  Eos 0. 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   brimonidine-timolol (COMBIGAN) 0.2-0.5 % ophthalmic solution Place 1 drop into both eyes every 12 (twelve) hours.   dorzolamide (TRUSOPT) 2 % ophthalmic solution Place 1 drop into both eyes 2 (two) times daily.   fexofenadine (ALLEGRA) 180 MG tablet Take 180 mg by mouth daily as needed  for allergies or rhinitis.   LUMIGAN 0.01 % SOLN Place 1 drop into both eyes at bedtime.   Multiple Vitamin (MULTIVITAMIN) capsule Take 1 capsule by mouth daily.   omeprazole (PRILOSEC) 20 MG capsule Take 20 mg by mouth daily.   valACYclovir (VALTREX) 500 MG tablet Take 500 mg by mouth 2 (two) times daily as needed (blisters).     Allergies:   Nsaids and Levofloxacin   Social History   Socioeconomic History   Marital status: Married    Spouse name: Not on file   Number of children: Not on file   Years of education: Not on file   Highest education level: Not on file  Occupational History   Not on file  Tobacco Use   Smoking status: Former    Current packs/day: 0.00    Average packs/day: 1 pack/day for 6.0 years (6.0 ttl pk-yrs)    Types: Cigarettes    Start date: 06/30/1973    Quit date: 06/28/1979    Years since quitting: 43.6   Smokeless tobacco: Never  Substance and Sexual Activity   Alcohol use: Not Currently    Comment: socially   Drug use: Never   Sexual activity: Not Currently  Other Topics Concern   Not on file  Social History Narrative   Not on file   Social Determinants of Health   Financial Resource Strain: Not on file  Food Insecurity: Not on file  Transportation Needs: Not on file  Physical Activity: Not on file  Stress: Not on file  Social Connections: Not on file     Family History: The patient's family history includes Alcoholism in her father; Deep vein thrombosis in her father; HIV/AIDS in her brother; Heart failure in her mother; Hypertension in her mother; Parkinson's disease in her mother. There is no history of COPD, Lupus, or Cancer.  ROS:   ROS Please see the history of present illness.     All other systems reviewed and are negative.  EKGs/Labs/Other Studies Reviewed:    The following studies were reviewed today:  EKG Interpretation Date/Time:  Thursday February 16 2023 16:02:24 EDT Ventricular Rate:  53 PR Interval:  166 QRS  Duration:  78 QT Interval:  424 QTC Calculation: 397 R Axis:   44  Text Interpretation: Sinus bradycardia minor ST abnormality non specific Low voltage QRS No previous ECGs available Confirmed by Norman Herrlich ([id number]) on 02/16/2023 4:03:34 PM    Physical Exam:    VS:  BP 120/80 (BP Location: Left Arm, Patient Position: Sitting, Cuff Size: Normal)   Pulse (!) 53   Ht 5\' 1"  (1.549 m)   Wt 141 lb (64 kg)   SpO2 99%   BMI 26.64 kg/m  Wt Readings from Last 3 Encounters:  02/16/23 141 lb (64 kg)  06/03/21 147 lb (66.7 kg)     GEN:  Well nourished, well developed in no acute distress HEENT: Normal NECK: No JVD; No carotid bruits LYMPHATICS: No lymphadenopathy CARDIAC: RRR, no murmurs, rubs, gallops RESPIRATORY:  Clear to auscultation without rales, wheezing or rhonchi  ABDOMEN: Soft, non-tender, non-distended MUSCULOSKELETAL:  No edema; No deformity  SKIN: Warm and dry NEUROLOGIC:  Alert and oriented x 3 PSYCHIATRIC:  Normal affect     Signed, Norman Herrlich, MD  02/16/2023 4:21 PM    [facility]

## 2023-02-16 ENCOUNTER — Ambulatory Visit: Payer: Medicare Other | Attending: Cardiology | Admitting: Cardiology

## 2023-02-16 ENCOUNTER — Ambulatory Visit: Payer: Medicare Other | Attending: Cardiology

## 2023-02-16 ENCOUNTER — Encounter: Payer: Self-pay | Admitting: Cardiology

## 2023-02-16 VITALS — BP 120/80 | HR 53 | Ht 61.0 in | Wt 141.0 lb

## 2023-02-16 DIAGNOSIS — R079 Chest pain, unspecified: Secondary | ICD-10-CM | POA: Insufficient documentation

## 2023-02-16 DIAGNOSIS — D225 Melanocytic nevi of trunk: Secondary | ICD-10-CM | POA: Insufficient documentation

## 2023-02-16 DIAGNOSIS — L821 Other seborrheic keratosis: Secondary | ICD-10-CM | POA: Insufficient documentation

## 2023-02-16 DIAGNOSIS — R001 Bradycardia, unspecified: Secondary | ICD-10-CM | POA: Insufficient documentation

## 2023-02-16 DIAGNOSIS — R0602 Shortness of breath: Secondary | ICD-10-CM

## 2023-02-16 DIAGNOSIS — R072 Precordial pain: Secondary | ICD-10-CM | POA: Insufficient documentation

## 2023-02-16 DIAGNOSIS — D1801 Hemangioma of skin and subcutaneous tissue: Secondary | ICD-10-CM | POA: Insufficient documentation

## 2023-02-16 DIAGNOSIS — L814 Other melanin hyperpigmentation: Secondary | ICD-10-CM | POA: Insufficient documentation

## 2023-02-16 NOTE — Patient Instructions (Signed)
Medication Instructions:  Your physician recommends that you continue on your current medications as directed. Please refer to the Current Medication list given to you today.  *If you need a refill on your cardiac medications before your next appointment, please call your pharmacy*   Lab Work: Your physician recommends that you return for lab work in:   Labs 1 week before CT: BMP  If you have labs (blood work) drawn today and your tests are completely normal, you will receive your results only by: MyChart Message (if you have MyChart) OR A paper copy in the mail If you have any lab test that is abnormal or we need to change your treatment, we will call you to review the results.   Testing/Procedures: Your physician has requested that you have an echocardiogram. Echocardiography is a painless test that uses sound waves to create images of your heart. It provides your doctor with information about the size and shape of your heart and how well your heart's chambers and valves are working. This procedure takes approximately one hour. There are no restrictions for this procedure. Please do NOT wear cologne, perfume, aftershave, or lotions (deodorant is allowed). Please arrive 15 minutes prior to your appointment time.  A zio monitor was ordered today. It will remain on for 7 days. You will then return monitor and event diary in provided box. It takes 1-2 weeks for report to be downloaded and returned to [place]. We will call you with the results. If monitor falls off or has orange flashing light, please call Zio for further instructions.   Vascuscreen    Your cardiac CT will be scheduled at one of the below locations:   [facility] [address] [place], [place] [address] [phone]  OR  [facility] [address] [place], [place] [address] [phone]  OR   [facility] [address] [place], [place] [address] [phone]  If scheduled at [facility], please arrive at the [facility] and Children's Entrance (Entrance C2) of [facility] 30 minutes prior to test start time. You can use the FREE valet parking offered at entrance C (encouraged to control the heart rate for the test)  Proceed to the [facility] Radiology Department (first floor) to check-in and test prep.  All radiology patients and guests should use entrance C2 at [facility], accessed from [facility], even though the hospital's physical address listed is [address].    If scheduled at [facility] or [facility], please arrive 15 mins early for check-in and test prep.  There is spacious parking and easy access to the radiology department from the [facility] Heart and Vascular entrance. Please enter here and check-in with the desk attendant.   Please follow these instructions carefully (unless otherwise directed):  An IV will be required for this test and Nitroglycerin will be given.  Hold all erectile dysfunction medications at least 3 days (72 hrs) prior to test. (Ie viagra, cialis, sildenafil, tadalafil, etc)   On the Night Before the Test: Be sure to Drink plenty of water. Do not consume any caffeinated/decaffeinated beverages or chocolate 12 hours prior to your test. Do not take any antihistamines 12 hours prior to your test.  On the Day of the Test: Drink plenty of water until 1 hour prior to the test. Do not eat any food 1 hour prior to test. You may  take your regular medications prior to the test.  FEMALES- please wear underwire-free bra if available, avoid dresses & tight clothing       After the Test: Drink plenty of water. After receiving IV contrast, you may experience a mild flushed feeling. This is normal. On occasion, you may experience a mild rash up to 24 hours after the test. This is not  dangerous. If this occurs, you can take Benadryl 25 mg and increase your fluid intake. If you experience trouble breathing, this can be serious. If it is severe call 911 IMMEDIATELY. If it is mild, please call our office.  We will call to schedule your test 2-4 weeks out understanding that some insurance companies will need an authorization prior to the service being performed.   For more information and frequently asked questions, please visit our website : [email or web address]  For non-scheduling related questions, please contact the cardiac imaging nurse navigator should you have any questions/concerns: Cardiac Imaging Nurse Navigators Direct Office Dial: [phone]   For scheduling needs, including cancellations and rescheduling, please call [place], [phone].    Follow-Up: At [facility], you and your health needs are our priority.  As part of our continuing mission to provide you with exceptional heart care, we have created designated Provider Care Teams.  These Care Teams include your primary Cardiologist (physician) and Advanced Practice Providers (APPs -  Physician Assistants and Nurse Practitioners) who all work together to provide you with the care you need, when you need it.  We recommend signing up for the patient portal called "MyChart".  Sign up information is provided on this After Visit Summary.  MyChart is used to connect with patients for Virtual Visits (Telemedicine).  Patients are able to view lab/test results, encounter notes, upcoming appointments, etc.  Non-urgent messages can be sent to your provider as well.   To learn more about what you can do with MyChart, go to [email or web address].    Your next appointment:   6 week(s)  Provider:   Norman Herrlich, MD    Other Instructions None

## 2023-02-20 ENCOUNTER — Telehealth (HOSPITAL_COMMUNITY): Payer: Self-pay | Admitting: *Deleted

## 2023-02-20 NOTE — Telephone Encounter (Signed)
Patient calling about her upcoming cardiac imaging study; pt verbalizes understanding of appt date/time, parking situation and where to check in, pre-test NPO status, and verified current allergies; name and call back number provided for further questions should they arise  Larey Brick RN Navigator Cardiac Imaging [facility] [phone] office [phone] cell  Patient aware to arrive at 3:30 PM.

## 2023-02-21 ENCOUNTER — Ambulatory Visit (HOSPITAL_COMMUNITY)
Admission: RE | Admit: 2023-02-21 | Discharge: 2023-02-21 | Disposition: A | Discharge status: Home / Self Care | Payer: Medicare Other | Source: Ambulatory Visit | Attending: Cardiology | Admitting: Cardiology

## 2023-02-21 DIAGNOSIS — R072 Precordial pain: Secondary | ICD-10-CM | POA: Diagnosis not present

## 2023-02-21 MED ORDER — NITROGLYCERIN 0.4 MG SL SUBL
0.8000 mg | SUBLINGUAL_TABLET | Freq: Once | SUBLINGUAL | Status: AC
  Administered 2023-02-21: 0.8 mg via SUBLINGUAL

## 2023-02-21 MED ORDER — IOHEXOL 350 MG/ML SOLN
95.0000 mL | Freq: Once | INTRAVENOUS | Status: AC | PRN
  Administered 2023-02-21: 95 mL via INTRAVENOUS

## 2023-02-21 MED ORDER — NITROGLYCERIN 0.4 MG SL SUBL
SUBLINGUAL_TABLET | SUBLINGUAL | Status: AC
  Filled 2023-02-21: qty 2

## 2023-03-01 DIAGNOSIS — R001 Bradycardia, unspecified: Secondary | ICD-10-CM | POA: Diagnosis not present

## 2023-03-01 DIAGNOSIS — R079 Chest pain, unspecified: Secondary | ICD-10-CM | POA: Diagnosis not present

## 2023-03-10 DIAGNOSIS — R079 Chest pain, unspecified: Secondary | ICD-10-CM | POA: Diagnosis not present

## 2023-03-10 DIAGNOSIS — R001 Bradycardia, unspecified: Secondary | ICD-10-CM | POA: Diagnosis not present

## 2023-03-10 DIAGNOSIS — R0602 Shortness of breath: Secondary | ICD-10-CM | POA: Diagnosis not present

## 2023-03-11 LAB — BASIC METABOLIC PANEL
BUN/Creatinine Ratio: 22 (ref 12–28)
BUN: 23 mg/dL (ref 8–27)
CO2: 25 mmol/L (ref 20–29)
Calcium: 9.7 mg/dL (ref 8.7–10.3)
Chloride: 101 mmol/L (ref 96–106)
Creatinine, Ser: 1.03 mg/dL — ABNORMAL HIGH (ref 0.57–1.00)
Glucose: 92 mg/dL (ref 70–99)
Potassium: 4.6 mmol/L (ref 3.5–5.2)
Sodium: 139 mmol/L (ref 134–144)
eGFR: 57 mL/min/{1.73_m2} — ABNORMAL LOW (ref >59)

## 2023-03-13 ENCOUNTER — Telehealth: Payer: Self-pay

## 2023-03-13 NOTE — Telephone Encounter (Signed)
-----   Message from Norman Herrlich sent at 03/12/2023  6:59 PM EDT ----- Normal or stable result

## 2023-03-13 NOTE — Telephone Encounter (Signed)
Patient notified through my chart.

## 2023-03-21 ENCOUNTER — Ambulatory Visit (INDEPENDENT_AMBULATORY_CARE_PROVIDER_SITE_OTHER): Payer: Medicare Other | Admitting: Podiatry

## 2023-03-21 ENCOUNTER — Ambulatory Visit (INDEPENDENT_AMBULATORY_CARE_PROVIDER_SITE_OTHER): Payer: Medicare Other

## 2023-03-21 DIAGNOSIS — M722 Plantar fascial fibromatosis: Secondary | ICD-10-CM

## 2023-03-21 DIAGNOSIS — M7752 Other enthesopathy of left foot: Secondary | ICD-10-CM

## 2023-03-21 DIAGNOSIS — M216X2 Other acquired deformities of left foot: Secondary | ICD-10-CM

## 2023-03-21 MED ORDER — MELOXICAM 7.5 MG PO TABS: 7.5000 mg | ORAL_TABLET | Freq: Every day | ORAL | 0 refills | Status: AC

## 2023-03-21 NOTE — Patient Instructions (Signed)

## 2023-03-21 NOTE — Progress Notes (Signed)
Subjective:  Patient ID: Susan Pena, female    DOB: 10/31/1945,  MRN: [id number]  Chief Complaint  Patient presents with   Foot Pain    Left heel pain. Has used OTC inserts. Soreness to heel. Pain does not radiate. Pain is worst when standing or walking for long distances.  *Patient stated she had a reaction to a steroid and caused her eye problems. She has had steroid injection to her heel in the past before the allergic reaction.**    77 y.o. female presents with the above complaint.  Patient presents for pain in the bottom of her left heel.  She says she has some pain in the back of her heel as well however more so on the bottom of the heel worse at the end of the day after she has been on her feet for a while.  She has tried over-the-counter inserts.   Review of Systems: Negative except as noted in the HPI. Denies N/V/F/Ch.   Objective:  There were no vitals filed for this visit. There is no height or weight on file to calculate BMI. Constitutional Well developed. Well nourished.  Vascular Dorsalis pedis pulses palpable bilaterally. Posterior tibial pulses palpable bilaterally. Capillary refill normal to all digits.  No cyanosis or clubbing noted. Pedal hair growth normal.  Neurologic Normal speech. Oriented to person, place, and time. Epicritic sensation to light touch grossly present bilaterally.  Dermatologic Nails well groomed and normal in appearance. No open wounds. No skin lesions.  Orthopedic: Normal joint ROM without pain or crepitus bilaterally. No visible deformities. Tender to palpation at the central plantar aspect of the calcaneus near the plantar calcaneal bursa left heel.  Mild pain on palpation of the posterior aspect of the heel however not significant No pain with calcaneal squeeze left. Ankle ROM diminished range of motion left. Silfverskiold Test: positive left.   Radiographs: Taken and reviewed. No acute fractures or dislocations. No evidence of  stress fracture.  Plantar heel spur present. 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   Assessment:   1. Calcaneal bursitis (heel), left   2. Plantar fasciitis of left foot   3. Acquired equinus deformity of left foot    Plan:  Patient was evaluated and treated and all questions answered.  #Plantar calcaneal bursitis left foot # Haglund deformity left foot - XR reviewed as above.  - Educated on icing and stretching. Instructions given.  - Injection delivered to the plantar calcaneal bursa as below. - DME: Deferred night splint and inserts at this time - Pharmacologic management: Meloxicam 7.5 mg daily for the next 30 days. Educated on risks/benefits and proper taking of medication.  Procedure: Injection plantar calcaneal bursa left Location: Left plantar calcaneal bursa at the glabrous junction; medial approach. Skin Prep: alcohol Injectate: 1 cc 0.5% marcaine plain, 1 cc kenalog 10. Disposition: Patient tolerated procedure well. Injection site dressed with a band-aid.  Return in about 4 weeks (around 04/18/2023) for f/u L heel bursitis.

## 2023-03-23 ENCOUNTER — Ambulatory Visit (INDEPENDENT_AMBULATORY_CARE_PROVIDER_SITE_OTHER): Payer: Medicare Other

## 2023-03-23 ENCOUNTER — Other Ambulatory Visit: Payer: Medicare Other

## 2023-03-23 ENCOUNTER — Telehealth: Payer: Self-pay

## 2023-03-23 ENCOUNTER — Encounter: Payer: Self-pay | Admitting: Cardiology

## 2023-03-23 ENCOUNTER — Ambulatory Visit: Payer: Medicare Other | Attending: Cardiology

## 2023-03-23 ENCOUNTER — Ambulatory Visit: Payer: Medicare Other

## 2023-03-23 DIAGNOSIS — R0602 Shortness of breath: Secondary | ICD-10-CM

## 2023-03-23 DIAGNOSIS — R079 Chest pain, unspecified: Secondary | ICD-10-CM

## 2023-03-23 DIAGNOSIS — R001 Bradycardia, unspecified: Secondary | ICD-10-CM

## 2023-03-23 DIAGNOSIS — H401111 Primary open-angle glaucoma, right eye, mild stage: Secondary | ICD-10-CM | POA: Diagnosis not present

## 2023-03-23 DIAGNOSIS — H401123 Primary open-angle glaucoma, left eye, severe stage: Secondary | ICD-10-CM | POA: Diagnosis not present

## 2023-03-23 LAB — ECHOCARDIOGRAM COMPLETE
Calc EF: 55 %
S' Lateral: 2.7 cm
Single Plane A2C EF: 45.5 %
Single Plane A4C EF: 60.7 %

## 2023-03-23 NOTE — Telephone Encounter (Signed)
Patient notified through my chart per Dr. Dulce Sellar

## 2023-03-23 NOTE — Telephone Encounter (Signed)
-----   Message from Norman Herrlich sent at 03/23/2023 10:56 AM EDT ----- Normal or stable result  I sent a MyChart message

## 2023-03-28 NOTE — Telephone Encounter (Signed)
Returned patient call, notified of all recent results

## 2023-03-28 NOTE — Telephone Encounter (Signed)
Patient returned staff call regarding results.

## 2023-04-03 ENCOUNTER — Encounter (HOSPITAL_COMMUNITY): Payer: Self-pay | Admitting: Cardiology

## 2023-04-05 ENCOUNTER — Ambulatory Visit: Payer: Medicare Other

## 2023-04-05 ENCOUNTER — Other Ambulatory Visit: Payer: Medicare Other

## 2023-04-06 ENCOUNTER — Encounter: Payer: Self-pay | Admitting: Cardiology

## 2023-04-06 ENCOUNTER — Ambulatory Visit: Payer: Medicare Other | Attending: Cardiology | Admitting: Cardiology

## 2023-04-06 VITALS — BP 98/68 | HR 68 | Ht 63.0 in | Wt 143.0 lb

## 2023-04-06 DIAGNOSIS — R931 Abnormal findings on diagnostic imaging of heart and coronary circulation: Secondary | ICD-10-CM | POA: Insufficient documentation

## 2023-04-06 DIAGNOSIS — R001 Bradycardia, unspecified: Secondary | ICD-10-CM | POA: Insufficient documentation

## 2023-04-06 DIAGNOSIS — R079 Chest pain, unspecified: Secondary | ICD-10-CM | POA: Diagnosis not present

## 2023-04-06 DIAGNOSIS — R0602 Shortness of breath: Secondary | ICD-10-CM | POA: Diagnosis not present

## 2023-04-06 NOTE — Patient Instructions (Signed)
Medication Instructions:  Your physician recommends that you continue on your current medications as directed. Please refer to the Current Medication list given to you today.  *If you need a refill on your cardiac medications before your next appointment, please call your pharmacy*   Lab Work: None If you have labs (blood work) drawn today and your tests are completely normal, you will receive your results only by: MyChart Message (if you have MyChart) OR A paper copy in the mail If you have any lab test that is abnormal or we need to change your treatment, we will call you to review the results.   Testing/Procedures: None   Follow-Up: At [facility], you and your health needs are our priority.  As part of our continuing mission to provide you with exceptional heart care, we have created designated Provider Care Teams.  These Care Teams include your primary Cardiologist (physician) and Advanced Practice Providers (APPs -  Physician Assistants and Nurse Practitioners) who all work together to provide you with the care you need, when you need it.  We recommend signing up for the patient portal called "MyChart".  Sign up information is provided on this After Visit Summary.  MyChart is used to connect with patients for Virtual Visits (Telemedicine).  Patients are able to view lab/test results, encounter notes, upcoming appointments, etc.  Non-urgent messages can be sent to your provider as well.   To learn more about what you can do with MyChart, go to [email or web address].    Your next appointment:   Follow up as needed  Provider:   Brian Munley, MD    Other Instructions None  

## 2023-04-06 NOTE — Progress Notes (Signed)
Cardiology Office Note:    Date:  04/06/2023   ID:  Zanyah Lentsch, DOB 08-02-1945, MRN [id number]  PCP:  Alinda Deem, MD  Cardiologist:  Norman Herrlich, MD    Referring MD: Alinda Deem, MD    ASSESSMENT:    1. Agatston coronary artery calcium score less than 100   2. SOB (shortness of breath) on exertion   3. Bradycardia   4. Chest pain of uncertain etiology    PLAN:    In order of problems listed above:  She has a low calcium score for age no evidence of atherosclerosis and vascular screening minimal coronary noncalcified plaque and is opted not to be on lipid-lowering therapy Mild bradycardia she can continue her beta-blocker eyedrops   Next appointment: I will see back in the office again as needed   Medication Adjustments/Labs and Tests Ordered: Current medicines are reviewed at length with the patient today.  Concerns regarding medicines are outlined above.  No orders of the defined types were placed in this encounter.  No orders of the defined types were placed in this encounter.    History of Present Illness:    Susan Pena is a 77 y.o. female with a hx of exertional shortness of breath and bradycardia detected by an Apple Watch heart rate in the mid 40s with beta-blocker eyedrop usage.  She was last seen 02/16/2023.  Following the visit she had several tests performed.  Echocardiogram 03/23/2023 showed normal left ventricular size wall thickness systolic diastolic function the right ventricle was also normal.  Vascular screening showed no carotid atherosclerosis ABIs were normal and abdominal aorta was normal without aneurysm.  Cardiac CTA was reported 02/21/2023 coronary calcium score was 11 34th percentile and there is minimal calcified plaque noted in the mid right coronary artery.  Over read of chest CTA by radiology no abnormal findings.  An event monitor was reported 03/06/2023.  Both ventricular and supraventricular ectopy were rare.  There were  39 triggered events these were sinus rhythm or sinus bradycardia.  Compliance with diet, lifestyle and medications: Yes  She is feeling better she has had a couple times her heart rate gets down into the high 30s low 40s at night but is not having daytime bradycardia with her smart watch and is not having cardiovascular symptoms of chest pain shortness of breath or palpitation She is reassured by the results of her multiple test I told her statin therapy is an option and she declines at this time Past Medical History:  Diagnosis Date   BMI 27.0-27.9,adult    Chronic cough 04/16/2021   Onset 2012  Daily cough ? p started timolol eyedrops ? > no better off them for a week as of ov 06/03/2021   -  GI probe "neg GERD"  Per [facility] ? While on ppi ?   -  04/16/2021 max gerd rx plus 1st gen H1 blockers per guidelines  > not able to take H1 per pharmacy, did not check with eye doctor  -Allergy profile 06/03/2021 >  Eos 0. 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   brimonidine-timolol (COMBIGAN) 0.2-0.5 % ophthalmic solution Place 1 drop into both eyes every 12 (twelve) hours.   dorzolamide (TRUSOPT) 2 % ophthalmic solution Place 1 drop into both eyes 2 (two) times daily.   fexofenadine (ALLEGRA) 180 MG tablet Take 180 mg by mouth daily as needed for allergies or rhinitis.   LUMIGAN 0.01 % SOLN Place 1 drop into both eyes at bedtime.   meloxicam (MOBIC) 7.5 MG tablet Take 1 tablet (7.5 mg total) by mouth daily.   Multiple Vitamin (MULTIVITAMIN) capsule Take 1 capsule by mouth  daily.   omeprazole (PRILOSEC) 20 MG capsule Take 20 mg by mouth daily.   valACYclovir (VALTREX) 500 MG tablet Take 500 mg by mouth 2 (two) times daily as needed (blisters).      EKGs/Labs/Other Studies Reviewed:    The following studies were reviewed today:  Cardiac Studies & Procedures       ECHOCARDIOGRAM  ECHOCARDIOGRAM COMPLETE 03/23/2023  Narrative ECHOCARDIOGRAM REPORT    Patient Name:   Susan Pena Date of Exam: 03/23/2023 Medical Rec #:  [id number]     Height:       61.0 in Accession #:    [id number]    Weight:       141.0 lb Date of Birth:  March 23, 1946      BSA:          1.628 m Patient Age:    77 years      BP:           120/80 mmHg Patient Gender: F             HR:           48 bpm. Exam Location:  [facility]  Procedure: 2D Echo, Cardiac Doppler and Color Doppler  Indications:    R06.02 SOB; R07.9* Chest pain, unspecified  History:        Patient has no prior history of Echocardiogram examinations. Signs/Symptoms:Shortness of Breath and Chest Pain; Risk Factors:Former Smoker. She has developed exercise symptoms of chest tightness shortness of breath.  Sonographer:    Vella Kohler BS, RVT, RDCS Referring Phys: [phone] Jerelyn Trimarco J Oralee Rapaport  IMPRESSIONS   1. Left ventricular ejection fraction, by estimation, is 60 to 65%. The left ventricle has normal function. The left ventricle has no regional wall motion abnormalities. Left ventricular diastolic parameters were normal. 2. Right ventricular systolic function is normal. The right ventricular size is normal. 3. The mitral valve is normal in structure. No evidence of mitral valve regurgitation. No evidence of mitral stenosis. 4. The aortic valve is tricuspid. Aortic valve regurgitation is not visualized. No aortic stenosis is present. 5. The inferior vena cava is normal in size with greater than 50% respiratory variability, suggesting right atrial pressure of 3 mmHg.  Comparison(s): No prior  Echocardiogram.  FINDINGS Left Ventricle: Left ventricular ejection fraction, by estimation, is 60 to 65%. The left ventricle has normal function. The left ventricle has no regional wall motion abnormalities. The left ventricular internal cavity size was normal in size. There is no left ventricular hypertrophy. Left ventricular diastolic parameters were normal.  Right Ventricle: The right ventricular size is normal. No increase in right ventricular wall thickness. Right ventricular systolic function is normal.  Left Atrium: Left atrial size was normal in size.  Right Atrium: Right atrial size was normal in size.  Pericardium: There is no evidence of pericardial effusion.  Mitral Valve: The mitral valve is normal  in structure. No evidence of mitral valve regurgitation. No evidence of mitral valve stenosis.  Tricuspid Valve: The tricuspid valve is not well visualized. Tricuspid valve regurgitation is not demonstrated. No evidence of tricuspid stenosis.  Aortic Valve: The aortic valve is tricuspid. Aortic valve regurgitation is not visualized. No aortic stenosis is present.  Pulmonic Valve: The pulmonic valve was normal in structure. Pulmonic valve regurgitation is not visualized. No evidence of pulmonic stenosis.  Aorta: The aortic root and ascending aorta are structurally normal, with no evidence of dilitation.  Venous: The inferior vena cava is normal in size with greater than 50% respiratory variability, suggesting right atrial pressure of 3 mmHg.  IAS/Shunts: No atrial level shunt detected by color flow Doppler.   LEFT VENTRICLE PLAX 2D LVIDd:         3.90 cm     Diastology LVIDs:         2.70 cm     LV e' medial:    11.40 cm/s LV PW:         0.60 cm     LV E/e' medial:  7.9 LV IVS:        0.90 cm     LV e' lateral:   16.90 cm/s LVOT diam:     2.00 cm     LV E/e' lateral: 5.3 LV SV:         86 LV SV Index:   53 LVOT Area:     3.14 cm  3D Volume EF: LV Volumes (MOD)            3D EF:        76 % LV vol d, MOD A2C: 35.8 ml LV EDV:       79 ml LV vol d, MOD A4C: 46.0 ml LV ESV:       19 ml LV vol s, MOD A2C: 19.5 ml LV SV:        60 ml LV vol s, MOD A4C: 18.1 ml LV SV MOD A2C:     16.3 ml LV SV MOD A4C:     46.0 ml LV SV MOD BP:      23.8 ml  RIGHT VENTRICLE RV Basal diam:  3.30 cm RV Mid diam:    2.20 cm RV S prime:     11.30 cm/s TAPSE (M-mode): 2.0 cm  LEFT ATRIUM             Index        RIGHT ATRIUM           Index LA diam:        3.10 cm 1.90 cm/m   RA Area:     12.80 cm LA Vol (A2C):   44.0 ml 27.03 ml/m  RA Volume:   30.60 ml  18.80 ml/m LA Vol (A4C):   39.6 ml 24.32 ml/m LA Biplane Vol: 41.7 ml 25.61 ml/m AORTIC VALVE             PULMONIC VALVE LVOT Vmax:   128.00 cm/s PV Vmax:          0.73 m/s LVOT Vmean:  78.100 cm/s PV Peak grad:     2.1 mmHg LVOT VTI:    0.274 m     PR End Diast Vel: 2.32 msec  AORTA Ao Root diam: 3.10 cm Ao Asc diam:  2.90 cm  MV E velocity: 89.50 cm/s MV A velocity: 67.40 cm/s  SHUNTS MV E/A ratio:  1.33  Systemic VTI:  0.27 m Systemic Diam: 2.00 cm  Sreedhar reddy Madireddy Electronically signed by Vern Claude reddy Madireddy Signature Date/Time: 03/23/2023/3:09:20 PM    Final    MONITORS  LONG TERM MONITOR (3-14 DAYS) 03/03/2023  Narrative Patch Wear Time:  7 days and 13 hours (2024-08-22T16:54:03-399 to 2024-08-30T06:11:28-0400)  Patient had a min HR of 39 bpm, max HR of 126 bpm, and avg HR of 59 bpm. Predominant underlying rhythm was Sinus Rhythm.  There were 39 triggered events.  Moreover sinus rhythm often sinus bradycardia without sinus pause or second or third-degree AV block.  Sinus pauses of 3 seconds or greater and no episodes of second or third-degree AV nodal block.  There were no episodes of atrial fibrillation or flutter.  1 run of Supraventricular Tachycardia occurred lasting 12 beats with a max rate of 121 bpm (avg 115 bpm).  Isolated SVEs were rare (<1.0%), SVE Couplets  were rare (<1.0%), and SVE Triplets were rare (<1.0%).  Isolated VEs were rare (<1.0%), and no VE Couplets or VE Triplets were present. Ventricular Bigeminy was present.   CT SCANS  CT CORONARY MORPH W/CTA COR W/SCORE 02/21/2023  Addendum 03/01/2023  7:22 PM ADDENDUM REPORT: 03/01/2023 19:20  EXAM: OVER-READ INTERPRETATION  CT CHEST  The following report is an over-read performed by radiologist Dr. Curly Shores [facility] on 03/01/2023. This over-read does not include interpretation of cardiac or coronary anatomy or pathology. The coronary CTA interpretation by the cardiologist is attached.  COMPARISON:  07/31/2015.  FINDINGS: Cardiovascular:  See findings discussed in the body of the report.  Mediastinum/Nodes: No suspicious adenopathy identified. Imaged mediastinal structures are unremarkable.  Lungs/Pleura: Imaged lungs are clear. No pleural effusion or pneumothorax.  Upper Abdomen: No acute abnormality.  Musculoskeletal: No chest wall abnormality. No acute or significant osseous findings.  IMPRESSION: No significant extracardiac incidental findings identified.   Electronically Signed By: Layla Maw M.D. On: 03/01/2023 19:20  Narrative CLINICAL DATA:  Chest pain  EXAM: Cardiac/Coronary CTA  TECHNIQUE: A non-contrast, gated CT scan was obtained with axial slices of 3 mm through the heart for calcium scoring. Calcium scoring was performed using the Agatston method. A 120 kV prospective, gated, contrast cardiac scan was obtained. Gantry rotation speed was 250 msecs and collimation was 0.6 mm. Two sublingual nitroglycerin tablets (0.8 mg) were given. The 3D data set was reconstructed in 5% intervals of the 35-75% of the R-R cycle. Diastolic phases were analyzed on a dedicated workstation using MPR, MIP, and VRT modes. The patient received 95 cc of contrast.  FINDINGS: Image quality: Excellent.  Noise artifact is: Limited.  Coronary  Arteries:  Normal coronary origin.  Right dominance.  Left main: The left main is a large caliber vessel with a normal take off from the left coronary cusp that bifurcates to form a left anterior descending artery and a left circumflex artery. There is no plaque or stenosis.  Left anterior descending artery: The LAD is patent without evidence of plaque or stenosis. The LAD gives off 2 patent diagonal branches.  Left circumflex artery: The LCX is non-dominant and patent with no evidence of plaque or stenosis. The LCX gives off 3 patent obtuse marginal branches.  Right coronary artery: The RCA is dominant with normal take off from the right coronary cusp. There is minimal calcified plaque in the proximal to mid RCA with associated stenosis of <25%. The RCA terminates as a PDA and right posterolateral branch without evidence of plaque or stenosis.  Right Atrium:  Right atrial size is within normal limits.  Right Ventricle: The right ventricular cavity is within normal limits.  Left Atrium: Left atrial size is normal in size with no left atrial appendage filling defect.  Left Ventricle: The ventricular cavity size is within normal limits.  Pulmonary arteries: Normal in size.  Pulmonary veins: Normal pulmonary venous drainage.  Pericardium: Normal thickness without significant effusion or calcium present.  Cardiac valves: The aortic valve is trileaflet without significant calcification. The mitral valve is normal without significant calcification.  Aorta: Normal caliber without significant disease.  Extra-cardiac findings: See attached radiology report for non-cardiac structures.  IMPRESSION: 1. Coronary calcium score of 11.4. This was 34th percentile for age-, sex, and race-matched controls.  2. Normal coronary origin with right dominance.  3. Minimal calcified plaque (<25%) in proximal to mid RCA. CAD RADs 1.  4. Consider preventive therapy and risk factor  modification.  RECOMMENDATIONS: 1. CAD-RADS 0: No evidence of CAD (0%). Consider non-atherosclerotic causes of chest pain.  2. CAD-RADS 1: Minimal non-obstructive CAD (0-24%). Consider non-atherosclerotic causes of chest pain. Consider preventive therapy and risk factor modification.  3. CAD-RADS 2: Mild non-obstructive CAD (25-49%). Consider non-atherosclerotic causes of chest pain. Consider preventive therapy and risk factor modification.  4. CAD-RADS 3: Moderate stenosis. Consider symptom-guided anti-ischemic pharmacotherapy as well as risk factor modification per guideline directed care. Additional analysis with CT FFR will be submitted.  5. CAD-RADS 4: Severe stenosis. (70-99% or > 50% left main). Cardiac catheterization or CT FFR is recommended. Consider symptom-guided anti-ischemic pharmacotherapy as well as risk factor modification per guideline directed care. Invasive coronary angiography recommended with revascularization per published guideline statements.  6. CAD-RADS 5: Total coronary occlusion (100%). Consider cardiac catheterization or viability assessment. Consider symptom-guided anti-ischemic pharmacotherapy as well as risk factor modification per guideline directed care.  7. CAD-RADS N: Non-diagnostic study. Obstructive CAD can't be excluded. Alternative evaluation is recommended.  Armanda Magic, MD  Electronically Signed: By: Armanda Magic M.D. On: 02/21/2023 17:14              Recent Labs: 03/10/2023: BUN 23; Creatinine, Ser 1.03; Potassium 4.6; Sodium 139  Recent Lipid Panel No results found for: "CHOL", "TRIG", "HDL", "CHOLHDL", "VLDL", "LDLCALC", "LDLDIRECT"  Physical Exam:    VS:  BP 98/68 (BP Location: Left Arm, Patient Position: Sitting)   Pulse 68   Ht 5\' 3"  (1.6 m)   Wt 143 lb (64.9 kg)   SpO2 98%   BMI 25.33 kg/m     Wt Readings from Last 3 Encounters:  04/06/23 143 lb (64.9 kg)  02/16/23 141 lb (64 kg)  06/03/21 147 lb (66.7  kg)     GEN:  Well nourished, well developed in no acute distress HEENT: Normal NECK: No JVD; No carotid bruits LYMPHATICS: No lymphadenopathy CARDIAC: RRR, no murmurs, rubs, gallops RESPIRATORY:  Clear to auscultation without rales, wheezing or rhonchi  ABDOMEN: Soft, non-tender, non-distended MUSCULOSKELETAL:  No edema; No deformity  SKIN: Warm and dry NEUROLOGIC:  Alert and oriented x 3 PSYCHIATRIC:  Normal affect    Signed, Norman Herrlich, MD  04/06/2023 3:22 PM    [facility]

## 2023-04-25 ENCOUNTER — Encounter: Payer: Self-pay | Admitting: Podiatry

## 2023-04-25 ENCOUNTER — Ambulatory Visit (INDEPENDENT_AMBULATORY_CARE_PROVIDER_SITE_OTHER): Payer: Medicare Other | Admitting: Podiatry

## 2023-04-25 DIAGNOSIS — M7752 Other enthesopathy of left foot: Secondary | ICD-10-CM

## 2023-04-25 DIAGNOSIS — M722 Plantar fascial fibromatosis: Secondary | ICD-10-CM

## 2023-04-25 NOTE — Progress Notes (Signed)
  Subjective:  Patient ID: Susan Pena, female    DOB: August 29, 1945,  MRN: [id number]  Chief Complaint  Patient presents with   Plantar Fasciitis    Left side f/up. 80% improvement. Injection 9/24    77 y.o. female presents for follow-up on left heel pain.  Previously diagnosed with plantar calcaneal bursitis.  States improved 80% improved had injection on September 24 that seemed to help.  Has changed her shoe gear is using shoes in the house and icing which helps a lot with pain at night.  Reports she has not been consistent with stretching at this point   Review of Systems: Negative except as noted in the HPI. Denies N/V/F/Ch.   Objective:  There were no vitals filed for this visit. There is no height or weight on file to calculate BMI. Constitutional Well developed. Well nourished.  Vascular Dorsalis pedis pulses palpable bilaterally. Posterior tibial pulses palpable bilaterally. Capillary refill normal to all digits.  No cyanosis or clubbing noted. Pedal hair growth normal.  Neurologic Normal speech. Oriented to person, place, and time. Epicritic sensation to light touch grossly present bilaterally.  Dermatologic Nails well groomed and normal in appearance. No open wounds. No skin lesions.  Orthopedic: Normal joint ROM without pain or crepitus bilaterally. No visible deformities. Decreased tender to palpation at the central plantar aspect of the calcaneus near the plantar calcaneal bursa left heel.  Mild pain on palpation of the posterior aspect of the heel however not significant No pain with calcaneal squeeze left. Ankle ROM diminished range of motion left. Silfverskiold Test: positive left.   Radiographs: Taken and reviewed. No acute fractures or dislocations. No evidence of stress fracture.  Plantar heel spur present. 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   Assessment:   1. Calcaneal bursitis (heel), left   2. Plantar fasciitis of  left foot     Plan:  Patient was evaluated and treated and all questions answered.  #Plantar calcaneal bursitis left foot # Haglund deformity left foot -Approximately 80% improved from prior injection as well as shoe gear modification and changes inserts and icing -Continue conservative therapy as above - XR reviewed as above.  - Educated on icing and stretching. Instructions given.  - Injection differential ordered at this visit - DME: Deferred night splint and inserts at this time - Pharmacologic management: Tylenol as needed for pain   Return if symptoms worsen or fail to improve.

## 2023-05-01 DIAGNOSIS — H353132 Nonexudative age-related macular degeneration, bilateral, intermediate dry stage: Secondary | ICD-10-CM | POA: Diagnosis not present

## 2023-05-19 DIAGNOSIS — Z23 Encounter for immunization: Secondary | ICD-10-CM | POA: Diagnosis not present

## 2023-06-07 DIAGNOSIS — Z1231 Encounter for screening mammogram for malignant neoplasm of breast: Secondary | ICD-10-CM | POA: Diagnosis not present

## 2023-06-27 DIAGNOSIS — Z6826 Body mass index (BMI) 26.0-26.9, adult: Secondary | ICD-10-CM | POA: Diagnosis not present

## 2023-06-27 DIAGNOSIS — F324 Major depressive disorder, single episode, in partial remission: Secondary | ICD-10-CM | POA: Diagnosis not present

## 2023-06-27 DIAGNOSIS — K219 Gastro-esophageal reflux disease without esophagitis: Secondary | ICD-10-CM | POA: Diagnosis not present

## 2023-06-27 DIAGNOSIS — E7800 Pure hypercholesterolemia, unspecified: Secondary | ICD-10-CM | POA: Diagnosis not present

## 2023-07-24 DIAGNOSIS — F329 Major depressive disorder, single episode, unspecified: Secondary | ICD-10-CM | POA: Diagnosis not present

## 2023-08-01 DIAGNOSIS — H401123 Primary open-angle glaucoma, left eye, severe stage: Secondary | ICD-10-CM | POA: Diagnosis not present

## 2023-08-01 DIAGNOSIS — H401111 Primary open-angle glaucoma, right eye, mild stage: Secondary | ICD-10-CM | POA: Diagnosis not present

## 2023-08-15 DIAGNOSIS — D485 Neoplasm of uncertain behavior of skin: Secondary | ICD-10-CM | POA: Diagnosis not present

## 2023-08-15 DIAGNOSIS — D224 Melanocytic nevi of scalp and neck: Secondary | ICD-10-CM | POA: Diagnosis not present

## 2023-08-15 DIAGNOSIS — L814 Other melanin hyperpigmentation: Secondary | ICD-10-CM | POA: Diagnosis not present

## 2023-08-15 DIAGNOSIS — D225 Melanocytic nevi of trunk: Secondary | ICD-10-CM | POA: Diagnosis not present

## 2023-08-15 DIAGNOSIS — L821 Other seborrheic keratosis: Secondary | ICD-10-CM | POA: Diagnosis not present

## 2023-08-15 DIAGNOSIS — L570 Actinic keratosis: Secondary | ICD-10-CM | POA: Diagnosis not present

## 2023-08-15 DIAGNOSIS — L820 Inflamed seborrheic keratosis: Secondary | ICD-10-CM | POA: Diagnosis not present

## 2023-08-15 DIAGNOSIS — B001 Herpesviral vesicular dermatitis: Secondary | ICD-10-CM | POA: Diagnosis not present

## 2023-08-15 DIAGNOSIS — L578 Other skin changes due to chronic exposure to nonionizing radiation: Secondary | ICD-10-CM | POA: Diagnosis not present

## 2023-08-15 DIAGNOSIS — D2271 Melanocytic nevi of right lower limb, including hip: Secondary | ICD-10-CM | POA: Diagnosis not present

## 2023-08-15 DIAGNOSIS — C44622 Squamous cell carcinoma of skin of right upper limb, including shoulder: Secondary | ICD-10-CM | POA: Diagnosis not present

## 2023-08-22 DIAGNOSIS — H401123 Primary open-angle glaucoma, left eye, severe stage: Secondary | ICD-10-CM | POA: Diagnosis not present

## 2023-09-18 DIAGNOSIS — C44622 Squamous cell carcinoma of skin of right upper limb, including shoulder: Secondary | ICD-10-CM | POA: Diagnosis not present

## 2023-09-20 DIAGNOSIS — H524 Presbyopia: Secondary | ICD-10-CM | POA: Diagnosis not present

## 2023-10-24 DIAGNOSIS — L570 Actinic keratosis: Secondary | ICD-10-CM | POA: Diagnosis not present

## 2023-10-24 DIAGNOSIS — L821 Other seborrheic keratosis: Secondary | ICD-10-CM | POA: Diagnosis not present

## 2023-10-24 DIAGNOSIS — B078 Other viral warts: Secondary | ICD-10-CM | POA: Diagnosis not present

## 2023-10-24 DIAGNOSIS — L820 Inflamed seborrheic keratosis: Secondary | ICD-10-CM | POA: Diagnosis not present

## 2023-11-07 DIAGNOSIS — H401111 Primary open-angle glaucoma, right eye, mild stage: Secondary | ICD-10-CM | POA: Diagnosis not present

## 2023-11-07 DIAGNOSIS — H401123 Primary open-angle glaucoma, left eye, severe stage: Secondary | ICD-10-CM | POA: Diagnosis not present

## 2023-11-21 DIAGNOSIS — H401123 Primary open-angle glaucoma, left eye, severe stage: Secondary | ICD-10-CM | POA: Diagnosis not present

## 2023-11-21 DIAGNOSIS — H401111 Primary open-angle glaucoma, right eye, mild stage: Secondary | ICD-10-CM | POA: Diagnosis not present

## 2023-12-05 DIAGNOSIS — M542 Cervicalgia: Secondary | ICD-10-CM | POA: Diagnosis not present

## 2023-12-06 DIAGNOSIS — H401123 Primary open-angle glaucoma, left eye, severe stage: Secondary | ICD-10-CM | POA: Diagnosis not present

## 2024-01-02 DIAGNOSIS — H401123 Primary open-angle glaucoma, left eye, severe stage: Secondary | ICD-10-CM | POA: Diagnosis not present

## 2024-01-02 DIAGNOSIS — Z01818 Encounter for other preprocedural examination: Secondary | ICD-10-CM | POA: Diagnosis not present

## 2024-01-08 DIAGNOSIS — H409 Unspecified glaucoma: Secondary | ICD-10-CM | POA: Diagnosis not present

## 2024-01-08 DIAGNOSIS — H401123 Primary open-angle glaucoma, left eye, severe stage: Secondary | ICD-10-CM | POA: Diagnosis not present

## 2024-01-16 DIAGNOSIS — Z6824 Body mass index (BMI) 24.0-24.9, adult: Secondary | ICD-10-CM | POA: Diagnosis not present

## 2024-01-16 DIAGNOSIS — Z1389 Encounter for screening for other disorder: Secondary | ICD-10-CM | POA: Diagnosis not present

## 2024-01-16 DIAGNOSIS — E7800 Pure hypercholesterolemia, unspecified: Secondary | ICD-10-CM | POA: Diagnosis not present

## 2024-01-16 DIAGNOSIS — Z0000 Encounter for general adult medical examination without abnormal findings: Secondary | ICD-10-CM | POA: Diagnosis not present

## 2024-01-18 DIAGNOSIS — H401123 Primary open-angle glaucoma, left eye, severe stage: Secondary | ICD-10-CM | POA: Diagnosis not present

## 2024-04-03 DIAGNOSIS — Z23 Encounter for immunization: Secondary | ICD-10-CM | POA: Diagnosis not present

## 2024-04-07 DIAGNOSIS — R051 Acute cough: Secondary | ICD-10-CM | POA: Diagnosis not present

## 2024-04-07 DIAGNOSIS — R059 Cough, unspecified: Secondary | ICD-10-CM | POA: Diagnosis not present

## 2024-04-07 DIAGNOSIS — J9801 Acute bronchospasm: Secondary | ICD-10-CM | POA: Diagnosis not present

## 2024-04-07 DIAGNOSIS — J22 Unspecified acute lower respiratory infection: Secondary | ICD-10-CM | POA: Diagnosis not present

## 2024-04-23 DIAGNOSIS — J069 Acute upper respiratory infection, unspecified: Secondary | ICD-10-CM | POA: Diagnosis not present

## 2024-05-01 DIAGNOSIS — L820 Inflamed seborrheic keratosis: Secondary | ICD-10-CM | POA: Diagnosis not present

## 2024-05-01 DIAGNOSIS — D0461 Carcinoma in situ of skin of right upper limb, including shoulder: Secondary | ICD-10-CM | POA: Diagnosis not present

## 2024-05-01 DIAGNOSIS — B078 Other viral warts: Secondary | ICD-10-CM | POA: Diagnosis not present

## 2024-05-01 DIAGNOSIS — D485 Neoplasm of uncertain behavior of skin: Secondary | ICD-10-CM | POA: Diagnosis not present

## 2024-05-01 DIAGNOSIS — L570 Actinic keratosis: Secondary | ICD-10-CM | POA: Diagnosis not present

## 2024-05-06 DIAGNOSIS — H353132 Nonexudative age-related macular degeneration, bilateral, intermediate dry stage: Secondary | ICD-10-CM | POA: Diagnosis not present

## 2024-05-06 DIAGNOSIS — Z9889 Other specified postprocedural states: Secondary | ICD-10-CM | POA: Diagnosis not present

## 2024-05-06 DIAGNOSIS — Z961 Presence of intraocular lens: Secondary | ICD-10-CM | POA: Diagnosis not present

## 2024-05-06 DIAGNOSIS — H4010X3 Unspecified open-angle glaucoma, severe stage: Secondary | ICD-10-CM | POA: Diagnosis not present

## 2024-05-07 DIAGNOSIS — C44622 Squamous cell carcinoma of skin of right upper limb, including shoulder: Secondary | ICD-10-CM | POA: Diagnosis not present

## 2024-05-20 DIAGNOSIS — H401123 Primary open-angle glaucoma, left eye, severe stage: Secondary | ICD-10-CM | POA: Diagnosis not present

## 2024-05-20 DIAGNOSIS — H401111 Primary open-angle glaucoma, right eye, mild stage: Secondary | ICD-10-CM | POA: Diagnosis not present

## 2024-05-21 DIAGNOSIS — L929 Granulomatous disorder of the skin and subcutaneous tissue, unspecified: Secondary | ICD-10-CM | POA: Diagnosis not present

## 2024-06-06 DIAGNOSIS — F329 Major depressive disorder, single episode, unspecified: Secondary | ICD-10-CM | POA: Diagnosis not present

## 2024-06-06 DIAGNOSIS — H409 Unspecified glaucoma: Secondary | ICD-10-CM | POA: Diagnosis not present

## 2024-06-06 DIAGNOSIS — K219 Gastro-esophageal reflux disease without esophagitis: Secondary | ICD-10-CM | POA: Diagnosis not present

## 2024-06-06 DIAGNOSIS — E7800 Pure hypercholesterolemia, unspecified: Secondary | ICD-10-CM | POA: Diagnosis not present
